# Patient Record
Sex: Female | Born: 1960 | Race: White | Hispanic: No | Marital: Married | Smoking: Current every day smoker
Health system: Southern US, Community
[De-identification: ages and names within clinical notes are randomized; demographics above are authoritative.]

## PROBLEM LIST (undated history)

## (undated) DIAGNOSIS — I10 Essential (primary) hypertension: Secondary | ICD-10-CM

## (undated) HISTORY — PX: CHOLECYSTECTOMY: SHX55

## (undated) HISTORY — DX: Essential (primary) hypertension: I10

## (undated) HISTORY — PX: ABDOMINAL HYSTERECTOMY: SHX81

---

## 2011-08-25 ENCOUNTER — Ambulatory Visit: Payer: Self-pay | Admitting: Family Medicine

## 2012-01-02 ENCOUNTER — Ambulatory Visit: Payer: Self-pay | Admitting: Gastroenterology

## 2012-01-03 ENCOUNTER — Ambulatory Visit: Payer: Self-pay | Admitting: Gastroenterology

## 2013-09-18 ENCOUNTER — Ambulatory Visit: Payer: Self-pay | Admitting: Family Medicine

## 2016-06-25 ENCOUNTER — Emergency Department: Payer: BLUE CROSS/BLUE SHIELD

## 2016-06-25 ENCOUNTER — Observation Stay
Admission: EM | Admit: 2016-06-25 | Discharge: 2016-06-26 | Disposition: A | Discharge status: Home / Self Care | Payer: BLUE CROSS/BLUE SHIELD | Attending: Internal Medicine | Admitting: Internal Medicine

## 2016-06-25 ENCOUNTER — Encounter: Payer: Self-pay | Admitting: Emergency Medicine

## 2016-06-25 DIAGNOSIS — H8110 Benign paroxysmal vertigo, unspecified ear: Secondary | ICD-10-CM | POA: Diagnosis not present

## 2016-06-25 DIAGNOSIS — Z79899 Other long term (current) drug therapy: Secondary | ICD-10-CM | POA: Diagnosis not present

## 2016-06-25 DIAGNOSIS — R0789 Other chest pain: Principal | ICD-10-CM | POA: Insufficient documentation

## 2016-06-25 DIAGNOSIS — R079 Chest pain, unspecified: Secondary | ICD-10-CM

## 2016-06-25 DIAGNOSIS — I10 Essential (primary) hypertension: Secondary | ICD-10-CM | POA: Diagnosis not present

## 2016-06-25 DIAGNOSIS — K219 Gastro-esophageal reflux disease without esophagitis: Secondary | ICD-10-CM | POA: Insufficient documentation

## 2016-06-25 DIAGNOSIS — Z8249 Family history of ischemic heart disease and other diseases of the circulatory system: Secondary | ICD-10-CM | POA: Insufficient documentation

## 2016-06-25 DIAGNOSIS — Z87891 Personal history of nicotine dependence: Secondary | ICD-10-CM | POA: Diagnosis not present

## 2016-06-25 DIAGNOSIS — E876 Hypokalemia: Secondary | ICD-10-CM | POA: Insufficient documentation

## 2016-06-25 DIAGNOSIS — Z23 Encounter for immunization: Secondary | ICD-10-CM | POA: Insufficient documentation

## 2016-06-25 LAB — FIBRIN DERIVATIVES D-DIMER (ARMC ONLY): Fibrin derivatives D-dimer (ARMC): 334.25 (ref 0.00–499.00)

## 2016-06-25 LAB — CBC
HCT: 39.3 % (ref 35.0–47.0)
Hemoglobin: 13.6 g/dL (ref 12.0–16.0)
MCH: 30.1 pg (ref 26.0–34.0)
MCHC: 34.6 g/dL (ref 32.0–36.0)
MCV: 86.9 fL (ref 80.0–100.0)
PLATELETS: 256 10*3/uL (ref 150–440)
RBC: 4.52 MIL/uL (ref 3.80–5.20)
RDW: 13 % (ref 11.5–14.5)
WBC: 7.3 10*3/uL (ref 3.6–11.0)

## 2016-06-25 LAB — COMPREHENSIVE METABOLIC PANEL
ALBUMIN: 3.8 g/dL (ref 3.5–5.0)
ALT: 14 U/L (ref 14–54)
ANION GAP: 6 (ref 5–15)
AST: 20 U/L (ref 15–41)
Alkaline Phosphatase: 81 U/L (ref 38–126)
BILIRUBIN TOTAL: 0.5 mg/dL (ref 0.3–1.2)
BUN: 14 mg/dL (ref 6–20)
CO2: 28 mmol/L (ref 22–32)
CREATININE: 0.77 mg/dL (ref 0.44–1.00)
Calcium: 8.7 mg/dL — ABNORMAL LOW (ref 8.9–10.3)
Chloride: 104 mmol/L (ref 101–111)
GFR calc Af Amer: >60 mL/min (ref >60)
GFR calc non Af Amer: >60 mL/min (ref >60)
GLUCOSE: 104 mg/dL — AB (ref 65–99)
Potassium: 3.3 mmol/L — ABNORMAL LOW (ref 3.5–5.1)
SODIUM: 138 mmol/L (ref 135–145)
Total Protein: 7 g/dL (ref 6.5–8.1)

## 2016-06-25 LAB — TROPONIN I

## 2016-06-25 MED ORDER — POTASSIUM CHLORIDE IN NACL 20-0.9 MEQ/L-% IV SOLN
INTRAVENOUS | Status: DC
  Administered 2016-06-25: 19:00:00 via INTRAVENOUS
  Filled 2016-06-25 (×2): qty 1000

## 2016-06-25 MED ORDER — ASPIRIN 325 MG PO TABS
325.0000 mg | ORAL_TABLET | Freq: Every day | ORAL | Status: DC
  Administered 2016-06-26: 325 mg via ORAL
  Filled 2016-06-25: qty 1

## 2016-06-25 MED ORDER — HYDROCHLOROTHIAZIDE 25 MG PO TABS
25.0000 mg | ORAL_TABLET | Freq: Every day | ORAL | Status: DC
  Administered 2016-06-26: 25 mg via ORAL
  Filled 2016-06-25: qty 1

## 2016-06-25 MED ORDER — NITROGLYCERIN 0.4 MG SL SUBL: 0.4000 mg | SUBLINGUAL_TABLET | SUBLINGUAL | Status: DC | PRN

## 2016-06-25 MED ORDER — MECLIZINE HCL 25 MG PO TABS
25.0000 mg | ORAL_TABLET | Freq: Three times a day (TID) | ORAL | Status: DC | PRN
  Administered 2016-06-25 – 2016-06-26 (×2): 25 mg via ORAL
  Filled 2016-06-25 (×2): qty 1

## 2016-06-25 MED ORDER — ENOXAPARIN SODIUM 40 MG/0.4ML ~~LOC~~ SOLN
40.0000 mg | SUBCUTANEOUS | Status: DC
  Administered 2016-06-25: 40 mg via SUBCUTANEOUS
  Filled 2016-06-25: qty 0.4

## 2016-06-25 MED ORDER — METOPROLOL TARTRATE 5 MG/5ML IV SOLN: 5.0000 mg | INTRAVENOUS | Status: DC | PRN

## 2016-06-25 MED ORDER — MECLIZINE HCL 25 MG PO TABS
25.0000 mg | ORAL_TABLET | Freq: Once | ORAL | Status: AC
  Administered 2016-06-25: 25 mg via ORAL

## 2016-06-25 MED ORDER — PNEUMOCOCCAL VAC POLYVALENT 25 MCG/0.5ML IJ INJ
0.5000 mL | INJECTION | INTRAMUSCULAR | Status: AC
  Administered 2016-06-26: 0.5 mL via INTRAMUSCULAR
  Filled 2016-06-25: qty 0.5

## 2016-06-25 MED ORDER — ACETAMINOPHEN 325 MG PO TABS
650.0000 mg | ORAL_TABLET | Freq: Four times a day (QID) | ORAL | Status: DC | PRN
  Administered 2016-06-25 – 2016-06-26 (×2): 650 mg via ORAL
  Filled 2016-06-25 (×2): qty 2

## 2016-06-25 MED ORDER — PANTOPRAZOLE SODIUM 40 MG PO TBEC
40.0000 mg | DELAYED_RELEASE_TABLET | Freq: Every day | ORAL | Status: DC
  Administered 2016-06-26: 40 mg via ORAL
  Filled 2016-06-25: qty 1

## 2016-06-25 MED ORDER — NICOTINE 14 MG/24HR TD PT24: 14.0000 mg | MEDICATED_PATCH | Freq: Every day | TRANSDERMAL | Status: DC

## 2016-06-25 MED ORDER — GI COCKTAIL ~~LOC~~
30.0000 mL | Freq: Once | ORAL | Status: AC
  Administered 2016-06-25: 30 mL via ORAL
  Filled 2016-06-25: qty 30

## 2016-06-25 MED ORDER — ACETAMINOPHEN 650 MG RE SUPP: 650.0000 mg | Freq: Four times a day (QID) | RECTAL | Status: DC | PRN

## 2016-06-25 MED ORDER — ASPIRIN 81 MG PO CHEW
324.0000 mg | CHEWABLE_TABLET | Freq: Once | ORAL | Status: AC
  Administered 2016-06-25: 324 mg via ORAL
  Filled 2016-06-25: qty 4

## 2016-06-25 MED ORDER — SODIUM CHLORIDE 0.9% FLUSH
3.0000 mL | Freq: Two times a day (BID) | INTRAVENOUS | Status: DC
  Administered 2016-06-25 – 2016-06-26 (×2): 3 mL via INTRAVENOUS

## 2016-06-25 NOTE — ED Triage Notes (Signed)
Intermittent chest pains that are accompanied by dizziness x 2 days. States [place]. Denies leg edema or pain.

## 2016-06-25 NOTE — ED Notes (Signed)
Admitting Provider at bedside. 

## 2016-06-25 NOTE — ED Provider Notes (Signed)
[facility] Emergency Department Provider Note   ____________________________________________   First MD Initiated Contact with Patient 06/25/16 1341     (approximate)  I have reviewed the triage vital signs and the nursing notes.   HISTORY  Chief Complaint Chest Pain   HPI Loretta Tyler is a 56 y.o. female here for evaluation of chest pain  Patient was in [place], she was hiking through [place] when she began experiencing a pain across the center of her chest is hard to describe then felt very hot, sweaty, lightheaded. This lasted for a little while, somewhat hard for her to recall how long. Over the last couple of days she's had 2 more episodes and she is concerned she could be having a "heart attack".  At the present time her symptoms have improved. Last episode of symptoms described this morning.   Was associated with some nausea but no vomiting. No leg swelling or thigh pain. She did travel to [place] by car symptoms started shortly thereafter   History reviewed. No pertinent past medical history.  Patient Active Problem List   Diagnosis Date Noted  . HTN (hypertension) 06/25/2016  . Chest pain 06/25/2016    Past Surgical History:  Procedure Laterality Date  . ABDOMINAL HYSTERECTOMY    . CHOLECYSTECTOMY      Prior to Admission medications   Medication Sig Start Date End Date Taking? Authorizing Provider  hydrochlorothiazide (HYDRODIURIL) 25 MG tablet Take 25 mg by mouth daily.   Yes Historical Provider, MD  ranitidine (ZANTAC) 150 MG tablet Take 150 mg by mouth daily as needed for heartburn.   Yes Historical Provider, MD    Allergies Lodine [etodolac] and Morphine and related  No family history on file.  Social History Social History  Substance Use Topics  . Smoking status: Former Games developer  . Smokeless tobacco: Not on file  . Alcohol use Not on file    Review of Systems Constitutional: No fever/chills Eyes: No visual  changes. ENT: No sore throat. Cardiovascular: See history of present illness Respiratory: See history of present illness. Denies feeling short of breath now. Sometimes in the pain and she feels that her breath away Gastrointestinal: No abdominal pain.  No nausea, no vomiting.  No diarrhea.  No constipation. Genitourinary: Negative for dysuria. Musculoskeletal: Negative for back pain. Skin: Negative for rash. Neurological: Negative for headaches, focal weakness or numbness.  10-point ROS otherwise negative.  ____________________________________________   PHYSICAL EXAM:  VITAL SIGNS: ED Triage Vitals [06/25/16 1312]  Enc Vitals Group     BP (!) 147/89     Pulse Rate 72     Resp 18     Temp 97.6 F (36.4 C)     Temp Source Oral     SpO2 98 %     Weight 140 lb (63.5 kg)     Height  (1.6 m)     Head Circumference      Peak Flow      Pain Score 1     Pain Loc      Pain Edu?      Excl. in GC?     Constitutional: Alert and oriented. Well appearing and in no acute distress. Eyes: Conjunctivae are normal. PERRL. EOMI. Head: Atraumatic. Nose: No congestion/rhinnorhea. Mouth/Throat: Mucous membranes are moist.  Oropharynx non-erythematous. Neck: No stridor.   Cardiovascular: Normal rate, regular rhythm. Grossly normal heart sounds.  Good peripheral circulation. Respiratory: Normal respiratory effort.  No retractions. Lungs CTAB. Gastrointestinal: Soft and  nontender. No distention.  Musculoskeletal: No lower extremity tenderness nor edema.  No thigh swelling. Equal calf circumference bilateral Neurologic:  Normal speech and language. No gross focal neurologic deficits are appreciated.  Skin:  Skin is warm, dry and intact. No rash noted. Psychiatric: Mood and affect are normal. Speech and behavior are normal.  ____________________________________________   LABS (all labs ordered are listed, but only abnormal results are displayed)  Labs Reviewed  COMPREHENSIVE  METABOLIC PANEL - Abnormal; Notable for the following:       Result Value   Potassium 3.3 (*)    Glucose, Bld 104 (*)    Calcium 8.7 (*)    All other components within normal limits  CBC  TROPONIN I  FIBRIN DERIVATIVES D-DIMER ([facility] ONLY)   ____________________________________________  EKG  ED ECG REPORT I, Rushawn Capshaw, the attending physician, personally viewed and interpreted this ECG.  Date: 06/25/2016 EKG Time: 1315 Rhythm: normal sinus rhythm QRS Axis: normal Intervals: normal ST/T Wave abnormalities: normal Conduction Disturbances: none Narrative Interpretation: unremarkable  ____________________________________________  RADIOLOGY  Dg Chest 2 View  Result Date: 06/25/2016 CLINICAL DATA:  Chest pain for 2 days. EXAM: CHEST  2 VIEW COMPARISON:  None. FINDINGS: No pneumothorax. Elevation of the left hemidiaphragm with adjacent atelectasis identified. The heart, hila, and mediastinum are normal. No pulmonary nodules or masses. No suspicious infiltrates. IMPRESSION: No active cardiopulmonary disease. Electronically Signed   By: Gerome Sam III M.D   On: 06/25/2016 14:51    ____________________________________________   PROCEDURES  Procedure(s) performed: None  Procedures  Critical Care performed: No  ____________________________________________   INITIAL IMPRESSION / ASSESSMENT AND PLAN / ED COURSE  Pertinent labs & imaging results that were available during my care of the patient were reviewed by me and considered in my medical decision making (see chart for details).  Temperature hours of chest pain. Some concerning symptoms and that she has chest pain followed by feeling of dizziness, slight breathlessness, got sweaty and occurred first while exerting herself walking. No obvious evidence of pulmonary wasn't or DVT, we'll screen via d-dimer given the patient's recent travel history. At the present time she is asymptomatic. She does have risk factors for  coronary disease including age, family history, smoking history and her symptoms are moderately concerning raising her wrist to moderate by heart score.  Do ripping, tear, or moving pain. No symptoms of dissection.    D-dimer < 500. Low risk WELLS for PE.     ____________________________________________   FINAL CLINICAL IMPRESSION(S) / ED DIAGNOSES  Final diagnoses:  Chest pain, moderate coronary artery risk      NEW MEDICATIONS STARTED DURING THIS VISIT:  New Prescriptions   No medications on file     Note:  This document was prepared using Dragon voice recognition software and may include unintentional dictation errors.     Sharyn Creamer, MD 06/25/16 [phone]

## 2016-06-25 NOTE — H&P (Signed)
PCP:   Marisue Ivan, MD   Chief Complaint:  Chest pain , dizziness  HPI: This is a 56 year old female who states that she developed centrally located chest pains on Thursday. Denies any radiation of the pain. Chest pain is associated with dizziness each time. Her chest pain is intermittent and has been coming and going since then. At this worse it is 7/10, currently 0/10. She does complain of dizziness currently. She denies any palpitation, shortness of breath. She states that she does feel presyncopal with her dizziness. She describes the pain as dull, she doesn't usually have chest discomfort. She denies any lower extremity edema. She states the pain is not associated with movement and it has occurred at rest as well as with activity. She does report severe GERD but states this comes and controlled with Zantac which she has been taking. She states that her chest discomfort currently does not feel like her heartburn. She's never had stress tests. She states the discomfort initially occurred while she is awaiting in [place] at the time she was horseback riding and mostly walking. She reports a cough but this is a chronic smoker's cough. She denies any fever, chills, nausea, vomiting or diarrhea. She denies any lower extremity edema. She finally presented to the ER. She does report to tinnitus which is chronic but denies any hearing loss. History provided the patient as well as her husband who is present at bedside.  Review of Systems:  The patient denies anorexia, fever, weight loss,, vision loss, decreased hearing, tinnitus, hoarseness, chest pain, syncope, dyspnea on exertion, peripheral edema, balance deficits, dizziness, hemoptysis, abdominal pain, melena, hematochezia, severe indigestion/heartburn, hematuria, incontinence, genital sores, muscle weakness, suspicious skin lesions, transient blindness, difficulty walking, depression, unusual weight change, abnormal bleeding, enlarged lymph nodes,  angioedema, and breast masses.  Past Medical History: History reviewed. No pertinent past medical history. Past Surgical History:  Procedure Laterality Date  . ABDOMINAL HYSTERECTOMY    . CHOLECYSTECTOMY      Medications: Prior to Admission medications   Medication Sig Start Date End Date Taking? Authorizing Provider  hydrochlorothiazide (HYDRODIURIL) 25 MG tablet Take 25 mg by mouth daily.   Yes Historical Provider, MD  ranitidine (ZANTAC) 150 MG tablet Take 150 mg by mouth daily as needed for heartburn.   Yes Historical Provider, MD    Allergies:   Allergies  Allergen Reactions  . Lodine [Etodolac] Hives  . Morphine And Related Nausea And Vomiting    Social History: Positive for tobacco. Negative for alcohol, illicit drugs  Family History: CAD, father age 42 Breast cancer, mother  Physical Exam: Vitals:   06/25/16 1312 06/25/16 1537  BP: (!) 147/89 (!) 148/84  Pulse: 72 63  Resp: 18   Temp: 97.6 F (36.4 C)   TempSrc: Oral   SpO2: 98% 97%  Weight: 63.5 kg (140 lb)   Height:  (1.6 m)     General:  Alert and oriented times three, well developed and nourished, no acute distress Eyes: PERRLA, pink conjunctiva, no scleral icterus ENT: Moist oral mucosa, neck supple, no thyromegaly Lungs: clear to ascultation, no wheeze, no crackles, no use of accessory muscles Cardiovascular: regular rate and rhythm, no regurgitation, no gallops, no murmurs. No carotid bruits, no JVD, no reproducible chest wall pain Abdomen: soft, positive BS, non-tender, non-distended, no organomegaly, not an acute abdomen GU: not examined Neuro: CN II - XII grossly intact, sensation intact Musculoskeletal: strength 5/5 all extremities, no clubbing, cyanosis or edema Skin: no rash, no  subcutaneous crepitation, no decubitus Psych: appropriate patient   Labs on Admission:   Recent Labs  06/25/16 1313  NA 138  K 3.3*  CL 104  CO2 28  GLUCOSE 104*  BUN 14  CREATININE 0.77  CALCIUM  8.7*    Recent Labs  06/25/16 1313  AST 20  ALT 14  ALKPHOS 81  BILITOT 0.5  PROT 7.0  ALBUMIN 3.8   No results for input(s): LIPASE, AMYLASE in the last 72 hours.  Recent Labs  06/25/16 1313  WBC 7.3  HGB 13.6  HCT 39.3  MCV 86.9  PLT 256    Recent Labs  06/25/16 1313  TROPONINI <0.03   Invalid input(s): POCBNP No results for input(s): DDIMER in the last 72 hours. No results for input(s): HGBA1C in the last 72 hours. No results for input(s): CHOL, HDL, LDLCALC, TRIG, CHOLHDL, LDLDIRECT in the last 72 hours. No results for input(s): TSH, T4TOTAL, T3FREE, THYROIDAB in the last 72 hours.  Invalid input(s): FREET3 No results for input(s): VITAMINB12, FOLATE, FERRITIN, TIBC, IRON, RETICCTPCT in the last 72 hours.  Micro Results: No results found for this or any previous visit (from the past 240 hour(s)).   Radiological Exams on Admission: Dg Chest 2 View  Result Date: 06/25/2016 CLINICAL DATA:  Chest pain for 2 days. EXAM: CHEST  2 VIEW COMPARISON:  None. FINDINGS: No pneumothorax. Elevation of the left hemidiaphragm with adjacent atelectasis identified. The heart, hila, and mediastinum are normal. No pulmonary nodules or masses. No suspicious infiltrates. IMPRESSION: No active cardiopulmonary disease. Electronically Signed   By: Gerome Sam III M.D   On: 06/25/2016 14:51    Assessment/Plan Present on Admission: . Chest pain -Admit to med telemetry -Cycle cardiac enzymes, lipid panel in a.m. -Lopressor when necessary -Orthostatic vitals ordered, repeat d-dimer in a.m. -IV fluid hydration with potassium -Single dose of GI cocktail ordered. Protonix by mouth daily -Stress test per primary team, inpatient versus outpatient -Resume home medications  Mild hypokalemia -Normal saline with 20 mEq potassium IV -Magnesium level  Hypertension -Stable, home medications resumed. When necessary Lopressor ordered  Tobacco abuse -Nicotine patch daily, duonebs  PRN Zali Kamaka 06/25/2016, 3:55 PM

## 2016-06-26 LAB — CBC
HEMATOCRIT: 38.1 % (ref 35.0–47.0)
HEMOGLOBIN: 12.8 g/dL (ref 12.0–16.0)
MCH: 29.9 pg (ref 26.0–34.0)
MCHC: 33.7 g/dL (ref 32.0–36.0)
MCV: 88.9 fL (ref 80.0–100.0)
Platelets: 218 10*3/uL (ref 150–440)
RBC: 4.29 MIL/uL (ref 3.80–5.20)
RDW: 12.8 % (ref 11.5–14.5)
WBC: 7.1 10*3/uL (ref 3.6–11.0)

## 2016-06-26 LAB — BASIC METABOLIC PANEL
ANION GAP: 6 (ref 5–15)
BUN: 17 mg/dL (ref 6–20)
CALCIUM: 8.4 mg/dL — AB (ref 8.9–10.3)
CO2: 28 mmol/L (ref 22–32)
CREATININE: 0.87 mg/dL (ref 0.44–1.00)
Chloride: 105 mmol/L (ref 101–111)
GFR calc non Af Amer: >60 mL/min (ref >60)
Glucose, Bld: 108 mg/dL — ABNORMAL HIGH (ref 65–99)
Potassium: 4 mmol/L (ref 3.5–5.1)
SODIUM: 139 mmol/L (ref 135–145)

## 2016-06-26 LAB — LIPID PANEL
Cholesterol: 153 mg/dL (ref 0–200)
HDL: 47 mg/dL (ref >40)
LDL CALC: 92 mg/dL (ref 0–99)
TRIGLYCERIDES: 68 mg/dL (ref <150)
Total CHOL/HDL Ratio: 3.3 RATIO
VLDL: 14 mg/dL (ref 0–40)

## 2016-06-26 LAB — FIBRIN DERIVATIVES D-DIMER (ARMC ONLY): Fibrin derivatives D-dimer (ARMC): 245.54 (ref 0.00–499.00)

## 2016-06-26 MED ORDER — NICOTINE 14 MG/24HR TD PT24: 14.0000 mg | MEDICATED_PATCH | Freq: Every day | TRANSDERMAL | 0 refills | Status: DC

## 2016-06-26 MED ORDER — MECLIZINE HCL 12.5 MG PO TABS: 12.5000 mg | ORAL_TABLET | Freq: Three times a day (TID) | ORAL | 0 refills | Status: AC | PRN

## 2016-06-26 NOTE — Discharge Instructions (Signed)

## 2016-06-26 NOTE — Discharge Summary (Signed)
[facility] - [facility] ([place])   PATIENT NAME: Loretta Tyler    MR#:  [id number]  DATE OF BIRTH:  1960-05-06  DATE OF ADMISSION:  06/25/2016 ADMITTING PHYSICIAN: Gery Pray, MD  DATE OF DISCHARGE: 06/26/2016  9:44 AM  PRIMARY CARE PHYSICIAN: Marisue Ivan, MD    ADMISSION DIAGNOSIS:  Chest pain, moderate coronary artery risk [R07.9]  DISCHARGE DIAGNOSIS:  Active Problems:   HTN (hypertension)   Chest pain   SECONDARY DIAGNOSIS:  History reviewed. No pertinent past medical history.  HOSPITAL COURSE:   1. Chest pain observation. The patient's cardiac enzymes were negative 3. The patient describes her chest pain as a half a second at a time starting last Thursday. Since the patient was admitted on Saturday evening no testing available on Sunday. Can follow-up as outpatient. Chest pain had resolved. Can consider outpatient stress test. 2. Essential hypertension on hydrochlorothiazide 3. Benign positional vertigo. I did put the patient through an Epley maneuver and brought on her dizziness and then relieved her dizziness. When necessary meclizine. Tympanic membranes bilaterally showed no erythema. 4. Hypokalemia replaced during the hospital course. Follow-up as outpatient and determine on whether hydrochlorothiazide is a good medication for this patient and not depending on her next potassium level.  DISCHARGE CONDITIONS:   Satisfactory  CONSULTS OBTAINED:   none  DRUG ALLERGIES:   Allergies  Allergen Reactions  . Lodine [Etodolac] Hives  . Morphine And Related Nausea And Vomiting    DISCHARGE MEDICATIONS:   Discharge Medication List as of 06/26/2016  9:06 AM    START taking these medications   Details  meclizine (ANTIVERT) 12.5 MG tablet Take 1 tablet (12.5 mg total) by mouth 3 (three) times daily as needed for dizziness., Starting Sun 06/26/2016, Print    nicotine (NICODERM CQ - DOSED IN MG/24 HOURS) 14 mg/24hr patch Place 1 patch (14 mg  total) onto the skin daily., Starting Sun 06/26/2016, Print      CONTINUE these medications which have NOT CHANGED   Details  hydrochlorothiazide (HYDRODIURIL) 25 MG tablet Take 25 mg by mouth daily., Historical Med    ranitidine (ZANTAC) 150 MG tablet Take 150 mg by mouth daily as needed for heartburn., Historical Med         DISCHARGE INSTRUCTIONS:   Follow-up PMD one week  If you experience worsening of your admission symptoms, develop shortness of breath, life threatening emergency, suicidal or homicidal thoughts you must seek medical attention immediately by calling 911 or calling your MD immediately  if symptoms less severe.  You Must read complete instructions/literature along with all the possible adverse reactions/side effects for all the Medicines you take and that have been prescribed to you. Take any new Medicines after you have completely understood and accept all the possible adverse reactions/side effects.   Please note  You were cared for by a hospitalist during your hospital stay. If you have any questions about your discharge medications or the care you received while you were in the hospital after you are discharged, you can call the unit and asked to speak with the hospitalist on call if the hospitalist that took care of you is not available. Once you are discharged, your primary care physician will handle any further medical issues. Please note that NO REFILLS for any discharge medications will be authorized once you are discharged, as it is imperative that you return to your primary care physician (or establish a relationship with a primary care physician if you do not  have one) for your aftercare needs so that they can reassess your need for medications and monitor your lab values.    Today   CHIEF COMPLAINT:   Chief Complaint  Patient presents with  . Chest Pain    HISTORY OF PRESENT ILLNESS:  Loretta Tyler  is a 56 y.o. female with a known history of  Hypertension presented with atypical chest pain and vertigo   VITAL SIGNS:  Blood pressure 137/66, pulse 70, temperature 98 F (36.7 C), temperature source Oral, resp. rate 18, height  (1.6 m), weight 63.5 kg (140 lb), SpO2 100 %.  I/O:   Intake/Output Summary (Last 24 hours) at 06/26/16 1545 Last data filed at 06/26/16 0600  Gross per 24 hour  Intake              855 ml  Output                0 ml  Net              855 ml    PHYSICAL EXAMINATION:  GENERAL:  56 y.o.-year-old patient lying in the bed with no acute distress.  EYES: Pupils equal, round, reactive to light and accommodation. No scleral icterus. Extraocular muscles intact.  HEENT: Head atraumatic, normocephalic. Oropharynx and nasopharynx clear.  NECK:  Supple, no jugular venous distention. No thyroid enlargement, no tenderness.  LUNGS: Normal breath sounds bilaterally, no wheezing, rales,rhonchi or crepitation. No use of accessory muscles of respiration.  CARDIOVASCULAR: S1, S2 normal. No murmurs, rubs, or gallops.  ABDOMEN: Soft, non-tender, non-distended. Bowel sounds present. No organomegaly or mass.  EXTREMITIES: No pedal edema, cyanosis, or clubbing.  NEUROLOGIC: Cranial nerves II through XII are intact. Muscle strength 5/5 in all extremities. Sensation intact. Gait not checked. Romberg negative.  finger-nose intact bilaterally. Epley maneuver brought on dizziness and then resolved her dizziness PSYCHIATRIC: The patient is alert and oriented x 3.  SKIN: No obvious rash, lesion, or ulcer.   DATA REVIEW:   CBC  Recent Labs Lab 06/26/16 0519  WBC 7.1  HGB 12.8  HCT 38.1  PLT 218    Chemistries   Recent Labs Lab 06/25/16 1313 06/26/16 0519  NA 138 139  K 3.3* 4.0  CL 104 105  CO2 28 28  GLUCOSE 104* 108*  BUN 14 17  CREATININE 0.77 0.87  CALCIUM 8.7* 8.4*  AST 20  --   ALT 14  --   ALKPHOS 81  --   BILITOT 0.5  --     Cardiac Enzymes  Recent Labs Lab 06/25/16 2047  TROPONINI <0.03      RADIOLOGY:  Dg Chest 2 View  Result Date: 06/25/2016 CLINICAL DATA:  Chest pain for 2 days. EXAM: CHEST  2 VIEW COMPARISON:  None. FINDINGS: No pneumothorax. Elevation of the left hemidiaphragm with adjacent atelectasis identified. The heart, hila, and mediastinum are normal. No pulmonary nodules or masses. No suspicious infiltrates. IMPRESSION: No active cardiopulmonary disease. Electronically Signed   By: Gerome Sam III M.D   On: 06/25/2016 14:51    Management plans discussed with the patient, And she is in agreement.  CODE STATUS:  Code Status History    Date Active Date Inactive Code Status Order ID Comments User Context   06/25/2016  6:01 PM 06/26/2016 12:49 PM Full Code [id number]  Gery Pray, MD Inpatient      TOTAL TIME TAKING CARE OF THIS PATIENT: 38 minutes.    Alford Highland M.D on 06/26/2016 at 3:45  PM  Between 7am to 6pm - Pager - [phone]  After 6pm go to [email or web address] - password [email or web address]  [facility] Office  [phone]  CC: Primary care physician; Marisue Ivan, MD

## 2016-06-26 NOTE — Progress Notes (Signed)
,  Patient given discharge teaching and paperwork regarding medications, diet, follow-up appointments and activity. Patient understanding verbalized. No complaints at this time. IV and telemetry discontinued prior to leaving. Skin assessment as previously charted and vitals are stable; on room air. Patient being discharged to home. Caregiver/family present during discharge teaching. Prescriptions handed to patient.

## 2016-06-27 LAB — HIV ANTIBODY (ROUTINE TESTING W REFLEX): HIV Screen 4th Generation wRfx: NONREACTIVE

## 2018-12-01 ENCOUNTER — Emergency Department: Payer: BC Managed Care – PPO

## 2018-12-01 ENCOUNTER — Other Ambulatory Visit: Payer: Self-pay

## 2018-12-01 DIAGNOSIS — R0602 Shortness of breath: Secondary | ICD-10-CM | POA: Diagnosis not present

## 2018-12-01 DIAGNOSIS — R200 Anesthesia of skin: Secondary | ICD-10-CM | POA: Insufficient documentation

## 2018-12-01 DIAGNOSIS — Z87891 Personal history of nicotine dependence: Secondary | ICD-10-CM | POA: Diagnosis not present

## 2018-12-01 DIAGNOSIS — R079 Chest pain, unspecified: Secondary | ICD-10-CM | POA: Insufficient documentation

## 2018-12-01 DIAGNOSIS — I10 Essential (primary) hypertension: Secondary | ICD-10-CM | POA: Insufficient documentation

## 2018-12-01 DIAGNOSIS — Z79899 Other long term (current) drug therapy: Secondary | ICD-10-CM | POA: Insufficient documentation

## 2018-12-01 LAB — COMPREHENSIVE METABOLIC PANEL
ALT: 11 U/L (ref 0–44)
AST: 14 U/L — ABNORMAL LOW (ref 15–41)
Albumin: 4.1 g/dL (ref 3.5–5.0)
Alkaline Phosphatase: 87 U/L (ref 38–126)
Anion gap: 9 (ref 5–15)
BUN: 13 mg/dL (ref 6–20)
CO2: 25 mmol/L (ref 22–32)
Calcium: 9.1 mg/dL (ref 8.9–10.3)
Chloride: 103 mmol/L (ref 98–111)
Creatinine, Ser: 0.75 mg/dL (ref 0.44–1.00)
GFR calc Af Amer: >60 mL/min (ref >60)
GFR calc non Af Amer: >60 mL/min (ref >60)
Glucose, Bld: 105 mg/dL — ABNORMAL HIGH (ref 70–99)
Potassium: 3.6 mmol/L (ref 3.5–5.1)
Sodium: 137 mmol/L (ref 135–145)
Total Bilirubin: 0.7 mg/dL (ref 0.3–1.2)
Total Protein: 7.2 g/dL (ref 6.5–8.1)

## 2018-12-01 LAB — CBC
HCT: 43 % (ref 36.0–46.0)
Hemoglobin: 14.2 g/dL (ref 12.0–15.0)
MCH: 28.7 pg (ref 26.0–34.0)
MCHC: 33 g/dL (ref 30.0–36.0)
MCV: 86.9 fL (ref 80.0–100.0)
Platelets: 288 10*3/uL (ref 150–400)
RBC: 4.95 MIL/uL (ref 3.87–5.11)
RDW: 12.4 % (ref 11.5–15.5)
WBC: 8.1 10*3/uL (ref 4.0–10.5)
nRBC: 0 % (ref 0.0–0.2)

## 2018-12-01 LAB — TROPONIN I (HIGH SENSITIVITY): Troponin I (High Sensitivity): 3 ng/L (ref <18)

## 2018-12-01 NOTE — ED Triage Notes (Addendum)
Pt states she has had intermittent left and right sided chest pain with feeling like she is going to faint when standing, shortness of breath, left sided arm numbness, jaw pain and throat pain. Pt states she has had symptoms for three days, pt denies known fever.

## 2018-12-02 ENCOUNTER — Other Ambulatory Visit: Payer: Self-pay

## 2018-12-02 ENCOUNTER — Emergency Department
Admission: EM | Admit: 2018-12-02 | Discharge: 2018-12-02 | Disposition: A | Discharge status: Home / Self Care | Payer: BC Managed Care – PPO | Attending: Emergency Medicine | Admitting: Emergency Medicine

## 2018-12-02 ENCOUNTER — Encounter: Payer: Self-pay | Admitting: Radiology

## 2018-12-02 ENCOUNTER — Emergency Department: Payer: BC Managed Care – PPO

## 2018-12-02 DIAGNOSIS — R079 Chest pain, unspecified: Secondary | ICD-10-CM

## 2018-12-02 LAB — TROPONIN I (HIGH SENSITIVITY): Troponin I (High Sensitivity): 3 ng/L (ref <18)

## 2018-12-02 MED ORDER — IOHEXOL 350 MG/ML SOLN
100.0000 mL | Freq: Once | INTRAVENOUS | Status: AC | PRN
  Administered 2018-12-02: 100 mL via INTRAVENOUS

## 2018-12-02 NOTE — ED Provider Notes (Signed)
[facility] Emergency Department Provider Note  ____________________________________________   First MD Initiated Contact with Patient 12/02/18 [id number]     (approximate)  I have reviewed the triage vital signs and the nursing notes.   HISTORY  Chief Complaint Chest Pain    HPI Loretta Tyler is a 58 y.o. female with history of hypertension who presents for evaluation of about 2 days of intermittent chest pain.  She says that the pain is anywhere from mild to severe and usually lasts for a few minutes.  Nothing particular makes it better or worse and it does not matter if she is exerting herself or not, it can happen while she is sitting and reading a book.  It is on both sides of her chest as well as in the middle and feels sharp.  She occasionally feels short of breath with the pain and each time that she has the pain in her chest she says that her right leg goes numb like it has fallen asleep.  This is consistent and she says it happens every time.  Occasionally she has had some intermittent numbness radiating down her left arm as well.  To me she denied having any jaw pain or throat pain.  She has not had any contact with known COVID-19 patients.  She denies sore throat, loss of smell or taste, cough, shortness of breath except associated with the chest pain, nausea, vomiting, and abdominal pain.  No weakness in her arms or her legs.  No trauma recently.  No history of blood clots in her legs or her lungs.  No unilateral leg pain or swelling.  No recent surgeries or immobilizations.   She occasionally feels like she is going to pass out when she has the symptoms.        History reviewed. No pertinent past medical history.  Patient Active Problem List   Diagnosis Date Noted   HTN (hypertension) 06/25/2016   Chest pain 06/25/2016    Past Surgical History:  Procedure Laterality Date   ABDOMINAL HYSTERECTOMY     CHOLECYSTECTOMY      Prior to Admission  medications   Medication Sig Start Date End Date Taking? Authorizing Provider  hydrochlorothiazide (HYDRODIURIL) 25 MG tablet Take 25 mg by mouth daily.    [provider]  meclizine (ANTIVERT) 12.5 MG tablet Take 1 tablet (12.5 mg total) by mouth 3 (three) times daily as needed for dizziness. 06/26/16   Alford Highland, MD  nicotine (NICODERM CQ - DOSED IN MG/24 HOURS) 14 mg/24hr patch Place 1 patch (14 mg total) onto the skin daily. 06/26/16   Alford Highland, MD  ranitidine (ZANTAC) 150 MG tablet Take 150 mg by mouth daily as needed for heartburn.    [provider]    Allergies Lodine [etodolac] and Morphine and related  No family history on file.  Social History Social History   Tobacco Use   Smoking status: Former Smoker   Smokeless tobacco: Never Used  Substance Use Topics   Alcohol use: Not on file   Drug use: Not on file    Review of Systems Constitutional: No fever/chills Eyes: No visual changes. ENT: No sore throat. Cardiovascular: +chest pain. Respiratory: Shortness of breath associated with the chest pain. Gastrointestinal: No abdominal pain.  No nausea, no vomiting.  No diarrhea.  No constipation. Genitourinary: Negative for dysuria. Musculoskeletal: Negative for neck pain.  Negative for back pain. Integumentary: Negative for rash. Neurological: Numbness of the right leg associated with the  chest pain.  Occasional numbness in the left arm also associated with the chest pain.   ____________________________________________   PHYSICAL EXAM:  VITAL SIGNS: ED Triage Vitals  Enc Vitals Group     BP 12/01/18 1912 (!) 159/93     Pulse Rate 12/01/18 1912 (!) 56     Resp 12/01/18 1912 16     Temp 12/01/18 1912 98 F (36.7 C)     Temp Source 12/01/18 1912 Oral     SpO2 12/01/18 1912 98 %     Weight 12/01/18 1913 67.1 kg (148 lb)     Height 12/01/18 1913 1.6 m (5\' 3" )     Head Circumference --      Peak Flow --      Pain Score 12/01/18  1912 0     Pain Loc --      Pain Edu? --      Excl. in [place]? --     Constitutional: Alert and oriented.  Anxious but not in distress and experiencing no pain at this time. Eyes: Conjunctivae are normal.  Head: Atraumatic. Nose: No congestion/rhinnorhea. Mouth/Throat: Mucous membranes are moist. Neck: No stridor.  No meningeal signs.   Cardiovascular: Normal rate, regular rhythm. Good peripheral circulation. Grossly normal heart sounds. Respiratory: Normal respiratory effort.  No retractions. Gastrointestinal: Soft and nontender. No distention.  No abdominal bruits or pulsatile masses. Musculoskeletal: No lower extremity tenderness nor edema. No gross deformities of extremities. Neurologic:  Normal speech and language. No gross focal neurologic deficits are appreciated.  Skin:  Skin is warm, dry and intact. Psychiatric: Mood and affect are anxious but generally appropriate under the circumstances.  ____________________________________________   LABS (all labs ordered are listed, but only abnormal results are displayed)  Labs Reviewed  COMPREHENSIVE METABOLIC PANEL - Abnormal; Notable for the following components:      Result Value   Glucose, Bld 105 (*)    AST 14 (*)    All other components within normal limits  CBC  TROPONIN I (HIGH SENSITIVITY)  TROPONIN I (HIGH SENSITIVITY)   ____________________________________________  EKG  ED ECG REPORT I, Hinda Kehr, the attending physician, personally viewed and interpreted this ECG.  Date: 12/01/2018 EKG Time: 19: 09 Rate: 53 Rhythm: Sinus bradycardia QRS Axis: normal Intervals: normal ST/T Wave abnormalities: normal Narrative Interpretation: no evidence of acute ischemia.  There is some artifact present but generally the EKG is reassuring.  ____________________________________________  RADIOLOGY I, Hinda Kehr, personally viewed and evaluated these images (plain radiographs) as part of my medical decision making, as  well as reviewing the written report by the radiologist.  ED MD interpretation: Chest x-ray does not demonstrate any emergent findings including no widened mediastinum.   No acute/emergent abnormalities identified on CTA chest/abd/pelvis.  Official radiology report(s): Dg Chest 2 View  Result Date: 12/01/2018 CLINICAL DATA:  Chest pain EXAM: CHEST - 2 VIEW COMPARISON:  06/25/2016 FINDINGS: Elevated left diaphragm. Subsegmental atelectasis or scar at the left base. Right lung is clear. Heart size is normal. No pneumothorax. IMPRESSION: No active cardiopulmonary disease. Subsegmental atelectasis or scar at the left base. Electronically Signed   By: Donavan Foil M.D.   On: 12/01/2018 19:40   Ct Angio Chest/abd/pel For Dissection W And/or W/wo  Result Date: 12/02/2018 CLINICAL DATA:  Chest pain, intermittent left arm numbness, jaw and throat pain EXAM: CT ANGIOGRAPHY CHEST, ABDOMEN AND PELVIS TECHNIQUE: Multidetector CT imaging through the chest, abdomen and pelvis was performed using the standard protocol during bolus administration of  intravenous contrast. Multiplanar reconstructed images and MIPs were obtained and reviewed to evaluate the vascular anatomy. CONTRAST:  OMNIPAQUE IOHEXOL 350 MG/ML SOLN COMPARISON:  Chest radiograph 12/01/2018 FINDINGS: CTA CHEST FINDINGS Cardiovascular: Noncontrast CT images of the chest reveal no abnormal hyperdense mural thickening or plaque displacement to suggest intramural hematoma of the thoracic aorta. Postcontrast imaging demonstrates preferential opacification of the aorta. The aortic root is suboptimally assessed given cardiac pulsation artifact. Atherosclerotic plaque within the normal caliber aorta. No intramural hematoma, dissection flap or other acute luminal abnormality of the aorta is seen. No periaortic stranding or hemorrhage. Central pulmonary arteries are normal caliber. No central or lobar filling defects are identified. Normal heart size. No  pericardial effusion. Mediastinum/Nodes: No enlarged mediastinal or axillary lymph nodes. Thyroid gland, trachea, and esophagus demonstrate no significant findings. Lungs/Pleura: Bandlike area of opacity in the lingula, likely atelectasis and/or scarring. Mild dependent atelectasis is present posteriorly. No consolidation, features of edema, pneumothorax, or effusion. No suspicious pulmonary nodules or masses. Musculoskeletal: No acute osseous abnormality or suspicious osseous lesion. No suspicious chest wall abnormalities. Review of the MIP images confirms the above findings. CTA ABDOMEN AND PELVIS FINDINGS VASCULAR Aorta: Atherosclerotic plaque within the normal caliber aorta. No evidence of aneurysm, dissection or vasculitis. 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 Mild poststenotic dilatation is present. No evidence of dissection, vasculitis or other stenosis within the proximal branches. SMA: Patent without evidence of aneurysm, dissection, vasculitis or significant stenosis. Widely patent. Renals: Single renal arteries bilaterally. Both renal arteries are patent without aneurysm, dissection, vasculitis or features of fibromuscular dysplasia IMA: Patent without evidence of aneurysm, dissection, vasculitis or significant stenosis. Inflow: Nonstenotic plaque within the proximal inflow vessels. No evidence of aneurysm, dissection or vasculitis. Veins: No obvious venous abnormality within the limitations of this arterial phase study. Review of the MIP images confirms the above findings. NON-VASCULAR Hepatobiliary: No focal liver abnormality is seen. Patient is post cholecystectomy. Slight prominence of the biliary tree likely related to reservoir effect. No calcified intraductal gallstones. Pancreas: Unremarkable. No pancreatic ductal dilatation or surrounding inflammatory changes. Spleen: Heterogeneity of the spleen  related to phase of contrast. No focal splenic abnormality Adrenals/Urinary Tract: . Adrenal glands are unremarkable. Kidneys are normal, without renal calculi, focal lesion, or hydronephrosis. Bladder is unremarkable. Stomach/Bowel: Distal esophagus, stomach and duodenal sweep are unremarkable. No bowel wall thickening or dilatation. No evidence of obstruction. A normal appendix is visualized. No colonic dilatation or wall thickening. Lymphatic: No suspicious or enlarged lymph nodes in the included lymphatic chains. Reproductive: Uterus is surgically absent. No concerning adnexal lesions. Other: No abdominopelvic free fluid or free gas. No bowel containing hernias. Musculoskeletal: Multilevel degenerative changes are present in the imaged portions of the spine. Features maximal at L4-5 with sclerotic endplate changes. No suspicious osseous lesions. Review of the MIP images confirms the above findings. IMPRESSION: 1. No evidence of acute aortic syndrome. 2. No acute intrathoracic or intra-abdominal process. 3. 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 4. Aortic Atherosclerosis (ICD10-I70.0). Electronically Signed   By: Kreg Shropshire M.D.   On: 12/02/2018 02:15    ____________________________________________   PROCEDURES   Procedure(s) performed (including Critical Care):  Procedures   ____________________________________________   INITIAL IMPRESSION /  MDM / ASSESSMENT AND PLAN / ED COURSE  As part of my medical decision making, I reviewed the following data within the electronic MEDICAL RECORD NUMBER Nursing notes reviewed and incorporated, Labs reviewed , EKG interpreted , Old chart reviewed, Radiograph reviewed  and Notes from prior ED visits   Differential diagnosis  includes, but is not limited to, ACS, PE, AAS or AAA, pneumonia, COVID-19.  The patient's vital signs are stable although she is hypertensive.  Heart rate has been consistently around 60 or even slightly bradycardic.  She has no signs or symptoms of infection.  Comprehensive metabolic panel, CBC, and 2 high-sensitivity troponins are all normal and reassuring.  She likely will be appropriate for discharge and outpatient follow-up and actually already has an appointment scheduled in a little over 24 hours with your primary care provider (Dr. Charlton HawsLithanvong).  However I am concerned about the presence of chest pain with neurological symptoms including the consistent reproduction of right leg numbness with the chest pain and occasional left arm numbness.  Pulmonary embolism is certainly possible but she has no risk factors and if she was under age 60 she would be PERC negative.  She has a wells score for PE of 0.  However based on the neurological symptoms associated with the chest pain, near syncope, hypertension, etc., I am going to obtain CTA chest/abdomen/pelvis to rule out dissection which should also give a good look at the possibility of pulmonary embolism as well.  The patient's renal function is normal and assuming that this study is negative anticipate she will be able to follow-up as an outpatient with her doctor in a little over 24 hours.  Of note, she reported to her nurse that her sister had a stroke last week and she is under a large amount of stress and anxiety as a result of that, which may be contributing to her current symptoms.      Clinical Course as of Dec 01 233  Sun Dec 02, 2018  0230 Reassuring CT Angio with no indication of emergent medical condition, specifically no evidence of PE nor acute aortic syndrome.  The patient is sleeping comfortably.  I updated her about the results and she is comfortable with plan for discharge and outpatient follow-up.  I am going to give her a full dose  aspirin prior to discharge and I gave my usual and customary return precautions.  She will see her primary care doctor in a little over 24 hours.  CT Angio Chest/Abd/Pel for Dissection W and/or W/WO [CF]  0234 As I was ordering the aspirin, I saw that she has history of hives with NSAIDs.  I will hold off on aspirin for now and defer to the judgment of Dr. Charlton HawsLithanvong or if she sees an outpatient cardiologist.   [CF]    Clinical Course User Index [CF] Loleta RoseForbach, Serenah Mill, MD     ____________________________________________  FINAL CLINICAL IMPRESSION(S) / ED DIAGNOSES  Final diagnoses:  Chest pain, unspecified type     MEDICATIONS GIVEN DURING THIS VISIT:  Medications  iohexol (OMNIPAQUE) 350 MG/ML injection 100 mL (100 mLs Intravenous Contrast Given 12/02/18 0144)     ED Discharge Orders    None      *Please note:  Loretta Tyler was evaluated in Emergency Department on 12/02/2018 for the symptoms described in the history of present illness. She was evaluated in the context of the global COVID-19 pandemic, which necessitated consideration that the patient might be at risk for infection with the  SARS-CoV-2 virus that causes COVID-19. Institutional protocols and algorithms that pertain to the evaluation of patients at risk for COVID-19 are in a state of rapid change based on information released by regulatory bodies including the CDC and federal and state organizations. These policies and algorithms were followed during the patient's care in the ED.  Some ED evaluations and interventions may be delayed as a result of limited staffing during the pandemic.*  Note:  This document was prepared using Dragon voice recognition software and may include unintentional dictation errors.   Loleta RoseForbach, Karagan Lehr, MD 12/02/18 [phone]

## 2018-12-02 NOTE — Discharge Instructions (Addendum)
As we discussed, your work-up tonight in the emergency department was reassuring.  Please let Dr. Netty Starring know at your appointment on Monday that you were here and that you had 2 normal troponins, overall reassuring lab work and EKG, and even a CTA of your chest/abdomen/pelvis which did not show any sign of aortic dissection or pulmonary embolism.  Dr. Doy Mince may wish to send you to cardiology for further outpatient work-up.  Return to the emergency department if you develop new or worsening symptoms that concern you.

## 2018-12-05 ENCOUNTER — Encounter: Payer: Self-pay | Admitting: Internal Medicine

## 2018-12-05 ENCOUNTER — Other Ambulatory Visit: Payer: Self-pay

## 2018-12-05 ENCOUNTER — Ambulatory Visit (INDEPENDENT_AMBULATORY_CARE_PROVIDER_SITE_OTHER): Payer: BC Managed Care – PPO | Admitting: Internal Medicine

## 2018-12-05 VITALS — BP 110/80 | HR 85 | Ht 63.0 in | Wt 151.5 lb

## 2018-12-05 DIAGNOSIS — I10 Essential (primary) hypertension: Secondary | ICD-10-CM

## 2018-12-05 DIAGNOSIS — R0789 Other chest pain: Secondary | ICD-10-CM | POA: Diagnosis not present

## 2018-12-05 NOTE — Patient Instructions (Addendum)
Medication Instructions:  Your physician has recommended you make the following change in your medication:   START OTC Ibuprofen 600mg  three times daily for 7 days  START OTC Omeprazole 20mg  daily  If you need a refill on your cardiac medications before your next appointment, please call your pharmacy.   Lab work: None ordered If you have labs (blood work) drawn today and your tests are completely normal, you will receive your results only by: Marland Kitchen MyChart Message (if you have MyChart) OR . A paper copy in the mail If you have any lab test that is abnormal or we need to change your treatment, we will call you to review the results.  Testing/Procedures: None ordered  Follow-Up: At [facility], you and your health needs are our priority.  As part of our continuing mission to provide you with exceptional heart care, we have created designated Provider Care Teams.  These Care Teams include your primary Cardiologist (physician) and Advanced Practice Providers (APPs -  Physician Assistants and Nurse Practitioners) who all work together to provide you with the care you need, when you need it. You will need a follow up appointment in 3-4 weeks.  Please call our office 2 months in advance to schedule this appointment.  You may see  Dr. Saunders Revel  or one of the following Advanced Practice Providers on your designated Care Team:   Murray Hodgkins, NP Christell Faith, PA-C . Marrianne Mood, PA-C  Any Other Special Instructions Will Be Listed Below (If Applicable). N/A

## 2018-12-05 NOTE — Progress Notes (Signed)
New Outpatient Visit Date: 12/05/2018  Referring Provider: Loleta Roseory Forbach, MD [facility] Emergency Department  Chief Complaint: Chest pain  HPI:  [name] is a 58 y.o. female who is being seen today for the evaluation of chest pain at the request of Dr. York CeriseForbach. She has a history of hypertension and [facility] 3 days ago with a two day history of intermittent chest pain.  She also reported associated right leg numbness as well as sporadic left arm numbness.  CTA of the chest, abdomen, and pelvis did not show any acute vascular abnormality.  No significant coronary artery calcification was identified; scattered atherosclerotic calcification was seen in the abdominal aorta.  HS-TnI was negative x 2.  Today, [name] reports that she continues to have daily chest pain.  It is sharp and substernal, lasting a second or two.  There are no exacerbating factors.  She notes some improvement with acetaminophen and ibuprofen.  She reports associated shortness of breath, dizziness, and warmth/tingling throughout her body.  She denies orthopnea, PND, and edema.  She has experienced a "heaviness" throughout her body when standing over the last few days as well as mild lightheadedness.  She denies syncope.  Loretta Tyler denies a history of heart disease and prior cardiac testing.  She denies trauma to the chest as well as recent lifting or other activities that may have brought on the pain.  --------------------------------------------------------------------------------------------------  Cardiovascular History & Procedures: Cardiovascular Problems:  Chest pain  Risk Factors:  Hypertension and prior tobacco use  Cath/PCI:  None  CV Surgery:  None  EP Procedures and Devices:  None  Non-Invasive Evaluation(s):  None  Recent CV Pertinent Labs: Lab Results  Component Value Date   CHOL 153 06/26/2016   HDL 47 06/26/2016   LDLCALC 92 06/26/2016   TRIG 68  06/26/2016   CHOLHDL 3.3 06/26/2016   K 3.6 12/01/2018   BUN 13 12/01/2018   CREATININE 0.75 12/01/2018    --------------------------------------------------------------------------------------------------  Past Medical History:  Diagnosis Date  . Hypertension     Past Surgical History:  Procedure Laterality Date  . ABDOMINAL HYSTERECTOMY    . CHOLECYSTECTOMY      Current Meds  Medication Sig  . hydrochlorothiazide (HYDRODIURIL) 25 MG tablet Take 25 mg by mouth daily.  Marland Kitchen. losartan (COZAAR) 25 MG tablet Take by mouth daily.  . meclizine (ANTIVERT) 12.5 MG tablet Take 1 tablet (12.5 mg total) by mouth 3 (three) times daily as needed for dizziness.  . ranitidine (ZANTAC) 150 MG tablet Take 150 mg by mouth daily as needed for heartburn.    Allergies: Lodine [etodolac] and Morphine and related  Social History   Tobacco Use  . Smoking status: Current Every Day Smoker    Packs/day: 0.25    Years: 30.00    Pack years: 7.50  . Smokeless tobacco: Never Used  Substance Use Topics  . Alcohol use: Not Currently  . Drug use: Not Currently    Family History  Problem Relation Age of Onset  . Heart Problems Father     Review of Systems: A 12-system review of systems was performed and was negative except as noted in the HPI.  --------------------------------------------------------------------------------------------------  Physical Exam: BP 110/80 (BP Location: Right Arm, Patient Position: Sitting, Cuff Size: Normal)   Pulse 85   Ht 5\' 3"  (1.6 m)   Wt 151 lb 8 oz (68.7 kg)   SpO2 98%   BMI 26.84 kg/m   General:  NAD HEENT:  No conjunctival pallor or scleral icterus. Moist mucous membranes. OP clear. Neck: Supple without lymphadenopathy, thyromegaly, JVD, or HJR. No carotid bruit. Lungs: Normal work of breathing. Clear to auscultation bilaterally without wheezes or crackles. Heart: Regular rate and rhythm without murmurs, rubs, or gallops. Non-displaced PMI. Abd: Bowel  sounds present. Soft, NT/ND without hepatosplenomegaly Ext: No lower extremity edema. Radial, PT, and DP pulses are 2+ bilaterally Skin: Warm and dry without rash. Neuro: CNIII-XII intact. Strength and fine-touch sensation intact in upper and lower extremities bilaterally. Psych: Normal mood and affect.  EKG:  NSR without abnormalities.  Lab Results  Component Value Date   WBC 8.1 12/01/2018   HGB 14.2 12/01/2018   HCT 43.0 12/01/2018   MCV 86.9 12/01/2018   PLT 288 12/01/2018    Lab Results  Component Value Date   NA 137 12/01/2018   K 3.6 12/01/2018   CL 103 12/01/2018   CO2 25 12/01/2018   BUN 13 12/01/2018   CREATININE 0.75 12/01/2018   GLUCOSE 105 (H) 12/01/2018   ALT 11 12/01/2018    Lab Results  Component Value Date   CHOL 153 06/26/2016   HDL 47 06/26/2016   LDLCALC 92 06/26/2016   TRIG 68 06/26/2016   CHOLHDL 3.3 06/26/2016     --------------------------------------------------------------------------------------------------  ASSESSMENT AND PLAN: Atypical chest pain: Pain timing and quality is not consistent with angina.  Recent CTA of the chest, abdomen, and pelvis (images personally reviewed) did not show any significant coronary artery calcification, though atherosclerosis was noted in the abdominal aorta.  We have discussed conservative treatment with NSAID's and PPI as well as further diagnostic testing and have agreed to the former.  I have recommmended that Ms. Singleterry take ibuprofen 600 mg TID x 1 week and omeprazole 20 mg daily.  If symptoms do not improve, we will need to consider obtaining an echocardiogram +/- Holter monitor and stress testing.  Hypertension: BP reasonable today.  No medication changes at this time.  Follow-up: RTC in 3-4 weeks.  Nelva Bush, MD 12/05/2018 11:47 AM

## 2018-12-26 ENCOUNTER — Ambulatory Visit: Payer: BC Managed Care – PPO | Admitting: Internal Medicine

## 2018-12-26 NOTE — Progress Notes (Deleted)
Follow-up Outpatient Visit Date: 12/26/2018  Primary Care Provider: Dion Body, MD [place] [id number]  Chief Complaint: ***  HPI:  [name] is a 58 y.o. year-old female with history of hypertension, who presents for follow-up of chest pain.  I met her in mid September after an emergency department visit for 2 days of intermittent chest pain.  At our visit, she continued to have daily sharp, substernal chest pain lasting 1 or 2 seconds.  It seemed to improve with acetaminophen and/or ibuprofen.  Given atypical nature of the pain and recent CTA of the chest, abdomen, and pelvis showing no significant coronary artery calcification, we agreed to conservative therapy with ibuprofen 600 mg 3 times daily x1 week and omeprazole 20 mg daily.  --------------------------------------------------------------------------------------------------  Cardiovascular History & Procedures: Cardiovascular Problems:  Chest pain  Risk Factors:  Hypertension and prior tobacco use  Cath/PCI:  None  CV Surgery:  None  EP Procedures and Devices:  None  Non-Invasive Evaluation(s):  None  Recent CV Pertinent Labs: Lab Results  Component Value Date   CHOL 153 06/26/2016   HDL 47 06/26/2016   LDLCALC 92 06/26/2016   TRIG 68 06/26/2016   CHOLHDL 3.3 06/26/2016   K 3.6 12/01/2018   BUN 13 12/01/2018   CREATININE 0.75 12/01/2018    Past medical and surgical history were reviewed and updated in EPIC.  No outpatient medications have been marked as taking for the 12/26/18 encounter (Appointment) with Jeffrie Lofstrom, [name], MD.    Allergies: Lodine [etodolac] and Morphine and related  Social History   Tobacco Use  . Smoking status: Current Every Day Smoker    Packs/day: 0.25    Years: 30.00    Pack years: 7.50  . Smokeless tobacco: Never Used  Substance Use Topics  . Alcohol use: Not Currently  . Drug use: Not Currently     Family History  Problem Relation Age of Onset  . Heart Problems Father     Review of Systems: A 12-system review of systems was performed and was negative except as noted in the HPI.  --------------------------------------------------------------------------------------------------  Physical Exam: There were no vitals taken for this visit.  General:  *** HEENT: No conjunctival pallor or scleral icterus. Moist mucous membranes.  OP clear. Neck: Supple without lymphadenopathy, thyromegaly, JVD, or HJR. No carotid bruit. Lungs: Normal work of breathing. Clear to auscultation bilaterally without wheezes or crackles. Heart: Regular rate and rhythm without murmurs, rubs, or gallops. Non-displaced PMI. Abd: Bowel sounds present. Soft, NT/ND without hepatosplenomegaly Ext: No lower extremity edema. Radial, PT, and DP pulses are 2+ bilaterally. Skin: Warm and dry without rash.  EKG:  ***  Lab Results  Component Value Date   WBC 8.1 12/01/2018   HGB 14.2 12/01/2018   HCT 43.0 12/01/2018   MCV 86.9 12/01/2018   PLT 288 12/01/2018    Lab Results  Component Value Date   NA 137 12/01/2018   K 3.6 12/01/2018   CL 103 12/01/2018   CO2 25 12/01/2018   BUN 13 12/01/2018   CREATININE 0.75 12/01/2018   GLUCOSE 105 (H) 12/01/2018   ALT 11 12/01/2018    Lab Results  Component Value Date   CHOL 153 06/26/2016   HDL 47 06/26/2016   LDLCALC 92 06/26/2016   TRIG 68 06/26/2016   CHOLHDL 3.3 06/26/2016    --------------------------------------------------------------------------------------------------  ASSESSMENT AND PLAN: [name] [name], MD 12/26/2018 7:32 AM

## 2019-04-05 ENCOUNTER — Other Ambulatory Visit: Payer: Self-pay | Admitting: Family Medicine

## 2019-04-05 DIAGNOSIS — Z1231 Encounter for screening mammogram for malignant neoplasm of breast: Secondary | ICD-10-CM

## 2019-04-09 ENCOUNTER — Ambulatory Visit
Admission: RE | Admit: 2019-04-09 | Discharge: 2019-04-09 | Disposition: A | Discharge status: Home / Self Care | Payer: BC Managed Care – PPO | Source: Ambulatory Visit | Attending: Family Medicine | Admitting: Family Medicine

## 2019-04-09 DIAGNOSIS — Z1231 Encounter for screening mammogram for malignant neoplasm of breast: Secondary | ICD-10-CM | POA: Insufficient documentation

## 2020-05-15 ENCOUNTER — Other Ambulatory Visit: Payer: Self-pay | Admitting: Family Medicine

## 2020-05-15 DIAGNOSIS — M7989 Other specified soft tissue disorders: Secondary | ICD-10-CM

## 2020-05-19 ENCOUNTER — Other Ambulatory Visit: Payer: Self-pay | Admitting: Family Medicine

## 2020-05-19 DIAGNOSIS — Z1231 Encounter for screening mammogram for malignant neoplasm of breast: Secondary | ICD-10-CM

## 2020-05-21 ENCOUNTER — Other Ambulatory Visit: Payer: Self-pay | Admitting: Family Medicine

## 2020-05-21 ENCOUNTER — Ambulatory Visit
Admission: RE | Admit: 2020-05-21 | Discharge: 2020-05-21 | Disposition: A | Discharge status: Home / Self Care | Payer: BC Managed Care – PPO | Source: Ambulatory Visit | Attending: Family Medicine | Admitting: Family Medicine

## 2020-05-21 ENCOUNTER — Other Ambulatory Visit: Payer: Self-pay

## 2020-05-21 DIAGNOSIS — M7989 Other specified soft tissue disorders: Secondary | ICD-10-CM | POA: Insufficient documentation

## 2020-06-09 ENCOUNTER — Other Ambulatory Visit: Payer: Self-pay

## 2020-06-09 ENCOUNTER — Ambulatory Visit
Admission: RE | Admit: 2020-06-09 | Discharge: 2020-06-09 | Disposition: A | Discharge status: Home / Self Care | Payer: BC Managed Care – PPO | Source: Ambulatory Visit | Attending: Family Medicine | Admitting: Family Medicine

## 2020-06-09 DIAGNOSIS — Z1231 Encounter for screening mammogram for malignant neoplasm of breast: Secondary | ICD-10-CM | POA: Diagnosis not present

## 2020-08-26 ENCOUNTER — Emergency Department
Admission: EM | Admit: 2020-08-26 | Discharge: 2020-08-26 | Disposition: A | Discharge status: Home / Self Care | Payer: BC Managed Care – PPO | Attending: Emergency Medicine | Admitting: Emergency Medicine

## 2020-08-26 ENCOUNTER — Other Ambulatory Visit: Payer: Self-pay

## 2020-08-26 ENCOUNTER — Emergency Department: Payer: BC Managed Care – PPO

## 2020-08-26 ENCOUNTER — Encounter: Payer: Self-pay | Admitting: Emergency Medicine

## 2020-08-26 DIAGNOSIS — Z79899 Other long term (current) drug therapy: Secondary | ICD-10-CM | POA: Insufficient documentation

## 2020-08-26 DIAGNOSIS — R42 Dizziness and giddiness: Secondary | ICD-10-CM | POA: Insufficient documentation

## 2020-08-26 DIAGNOSIS — I10 Essential (primary) hypertension: Secondary | ICD-10-CM | POA: Diagnosis not present

## 2020-08-26 DIAGNOSIS — R55 Syncope and collapse: Secondary | ICD-10-CM | POA: Insufficient documentation

## 2020-08-26 DIAGNOSIS — F17200 Nicotine dependence, unspecified, uncomplicated: Secondary | ICD-10-CM | POA: Insufficient documentation

## 2020-08-26 LAB — BASIC METABOLIC PANEL
Anion gap: 12 (ref 5–15)
BUN: 14 mg/dL (ref 8–23)
CO2: 22 mmol/L (ref 22–32)
Calcium: 8.8 mg/dL — ABNORMAL LOW (ref 8.9–10.3)
Chloride: 99 mmol/L (ref 98–111)
Creatinine, Ser: 0.91 mg/dL (ref 0.44–1.00)
GFR, Estimated: >60 mL/min (ref >60)
Glucose, Bld: 112 mg/dL — ABNORMAL HIGH (ref 70–99)
Potassium: 3.4 mmol/L — ABNORMAL LOW (ref 3.5–5.1)
Sodium: 133 mmol/L — ABNORMAL LOW (ref 135–145)

## 2020-08-26 LAB — CBC
HCT: 40.7 % (ref 36.0–46.0)
Hemoglobin: 14.1 g/dL (ref 12.0–15.0)
MCH: 29.7 pg (ref 26.0–34.0)
MCHC: 34.6 g/dL (ref 30.0–36.0)
MCV: 85.7 fL (ref 80.0–100.0)
Platelets: 294 10*3/uL (ref 150–400)
RBC: 4.75 MIL/uL (ref 3.87–5.11)
RDW: 12.4 % (ref 11.5–15.5)
WBC: 9.1 10*3/uL (ref 4.0–10.5)
nRBC: 0 % (ref 0.0–0.2)

## 2020-08-26 LAB — TROPONIN I (HIGH SENSITIVITY)
Troponin I (High Sensitivity): 3 ng/L (ref <18)
Troponin I (High Sensitivity): 3 ng/L (ref <18)

## 2020-08-26 MED ORDER — SODIUM CHLORIDE 0.9 % IV BOLUS
1000.0000 mL | Freq: Once | INTRAVENOUS | Status: AC
  Administered 2020-08-26: 1000 mL via INTRAVENOUS

## 2020-08-26 NOTE — ED Notes (Signed)
ED Provider at bedside. 

## 2020-08-26 NOTE — Discharge Instructions (Addendum)
Please seek medical attention for any high fevers, chest pain, shortness of breath, change in behavior, persistent vomiting, bloody stool or any other new or concerning symptoms.  

## 2020-08-26 NOTE — ED Notes (Signed)
Dr Derrill Kay notified of orthostatic VS.

## 2020-08-26 NOTE — ED Provider Notes (Signed)
[facility] Emergency Department Provider Note  ____________________________________________   I have reviewed the triage vital signs and the nursing notes.   HISTORY  Chief Complaint LIghtheadedness  History limited by: Not Limited   HPI Loretta Tyler is a 60 y.o. female who presents to the emergency department today because of concern for lightheadedness. The patient states that she has been having this symptom for the past few days. The patient says that it has been intermittent. It will not necessarily follow any pattern that the patient has been able to identify. The patient says that she has a history of vertigo and it does somewhat remind her of that. She did have some discomfort in her chest as well.    Records reviewed. Per medical record review patient has a history of HTN.  Past Medical History:  Diagnosis Date  . Hypertension     Patient Active Problem List   Diagnosis Date Noted  . HTN (hypertension) 06/25/2016  . Atypical chest pain 06/25/2016    Past Surgical History:  Procedure Laterality Date  . ABDOMINAL HYSTERECTOMY    . CHOLECYSTECTOMY      Prior to Admission medications   Medication Sig Start Date End Date Taking? Authorizing Provider  hydrochlorothiazide (HYDRODIURIL) 25 MG tablet Take 25 mg by mouth daily.    [provider]  losartan (COZAAR) 25 MG tablet Take by mouth daily. 12/03/18 12/03/19  [provider]  meclizine (ANTIVERT) 12.5 MG tablet Take 1 tablet (12.5 mg total) by mouth 3 (three) times daily as needed for dizziness. 06/26/16   Alford Highland, MD  ranitidine (ZANTAC) 150 MG tablet Take 150 mg by mouth daily as needed for heartburn.    [provider]    Allergies Lodine [etodolac] and Morphine and related  Family History  Problem Relation Age of Onset  . Heart Problems Father   . Breast cancer Neg Hx     Social History Social History   Tobacco Use  . Smoking status:  Current Every Day Smoker    Packs/day: 0.25    Years: 30.00    Pack years: 7.50  . Smokeless tobacco: Never Used  Substance Use Topics  . Alcohol use: Not Currently  . Drug use: Not Currently    Review of Systems Constitutional: No fever/chills Eyes: No visual changes. ENT: No sore throat. Cardiovascular: Positive for chest discomfort.  Respiratory: Denies shortness of breath. Gastrointestinal: No abdominal pain.  No nausea, no vomiting.  No diarrhea.   Genitourinary: Negative for dysuria. Musculoskeletal: Negative for back pain. Skin: Negative for rash. Neurological: Positive for lightheadedness.  ____________________________________________   PHYSICAL EXAM:  VITAL SIGNS: ED Triage Vitals  Enc Vitals Group     BP 08/26/20 1527 125/88     Pulse Rate 08/26/20 1527 83     Resp 08/26/20 1527 18     Temp 08/26/20 1527 98.4 F (36.9 C)     Temp Source 08/26/20 1527 Oral     SpO2 08/26/20 1527 97 %     Weight 08/26/20 1527 149 lb (67.6 kg)     Height 08/26/20 1527 5\' 3"  (1.6 m)     Head Circumference --      Peak Flow --      Pain Score 08/26/20 1531 4   Constitutional: Alert and oriented.  Eyes: Conjunctivae are normal.  ENT      Head: Normocephalic and atraumatic.      Nose: No congestion/rhinnorhea.      Mouth/Throat: Mucous membranes  are moist.      Neck: No stridor. Hematological/Lymphatic/Immunilogical: No cervical lymphadenopathy. Cardiovascular: Normal rate, regular rhythm.  No murmurs, rubs, or gallops.  Respiratory: Normal respiratory effort without tachypnea nor retractions. Breath sounds are clear and equal bilaterally. No wheezes/rales/rhonchi. Gastrointestinal: Soft and non tender. No rebound. No guarding.  Genitourinary: Deferred Musculoskeletal: Normal range of motion in all extremities. No lower extremity edema. Neurologic:  Normal speech and language. No gross focal neurologic deficits are appreciated.  Skin:  Skin is warm, dry and intact. No rash  noted. Psychiatric: Mood and affect are normal. Speech and behavior are normal. Patient exhibits appropriate insight and judgment.  ____________________________________________    LABS (pertinent positives/negatives)  BMP na 133, k 3.4, glu 112, cr 0.91 Trop hs 3 x 2 CBC wbc 9.1, hgb 14.1, plt 294 ____________________________________________   EKG  I, Phineas Semen, attending physician, personally viewed and interpreted this EKG  EKG Time: 1512 Rate: 92 Rhythm: normal sinus rhythm Axis: normal Intervals: qtc 437 QRS: narrow ST changes: no st elevation Impression: right atrial enlargement    ____________________________________________    RADIOLOGY  CXR No acute findings   ____________________________________________   PROCEDURES  Procedures  ____________________________________________   INITIAL IMPRESSION / ASSESSMENT AND PLAN / ED COURSE  Pertinent labs & imaging results that were available during my care of the patient were reviewed by me and considered in my medical decision making (see chart for details).   Patient presents to the emergency department today because of episodes of intermittent lightheadedness.  This was accompanied by some chest pain.  Patient was found to be orthostatic here in the emergency department.  Troponin negative x2, I doubt ACS. I also doubt dissection or PE given clinical history. Patient was given fluids and did feel better afterwards. Do wonder if lightheadedness was in part due to dehydration. Encouraged PCP follow up with patient.   ____________________________________________   FINAL CLINICAL IMPRESSION(S) / ED DIAGNOSES  Final diagnoses:  Near syncope     Note: This dictation was prepared with Dragon dictation. Any transcriptional errors that result from this process are unintentional     Phineas Semen, MD 08/26/20 2250

## 2020-08-26 NOTE — ED Triage Notes (Signed)
Pt comes into the ED via POV c/o central chest pain that has no radiation.  Pt does admit to [facility] and dizziness to the point where she has near syncopal episodes.  Pt denies any cardiac history.  Pt states this pain has been ongoing x 3 days.  Pt currently has even and unlabored respirations and is in NAD.

## 2020-11-10 IMAGING — CR DG CHEST 2V
1 series · 2 of 2 positions shown · non-contrast
Comparison: 06/25/2016

CLINICAL DATA: Chest pain

EXAM:
CHEST - 2 VIEW

[Series 1: dg chest 2 view · 0.14mm/px · 2 of 2 slices shown]
[im 1/2]
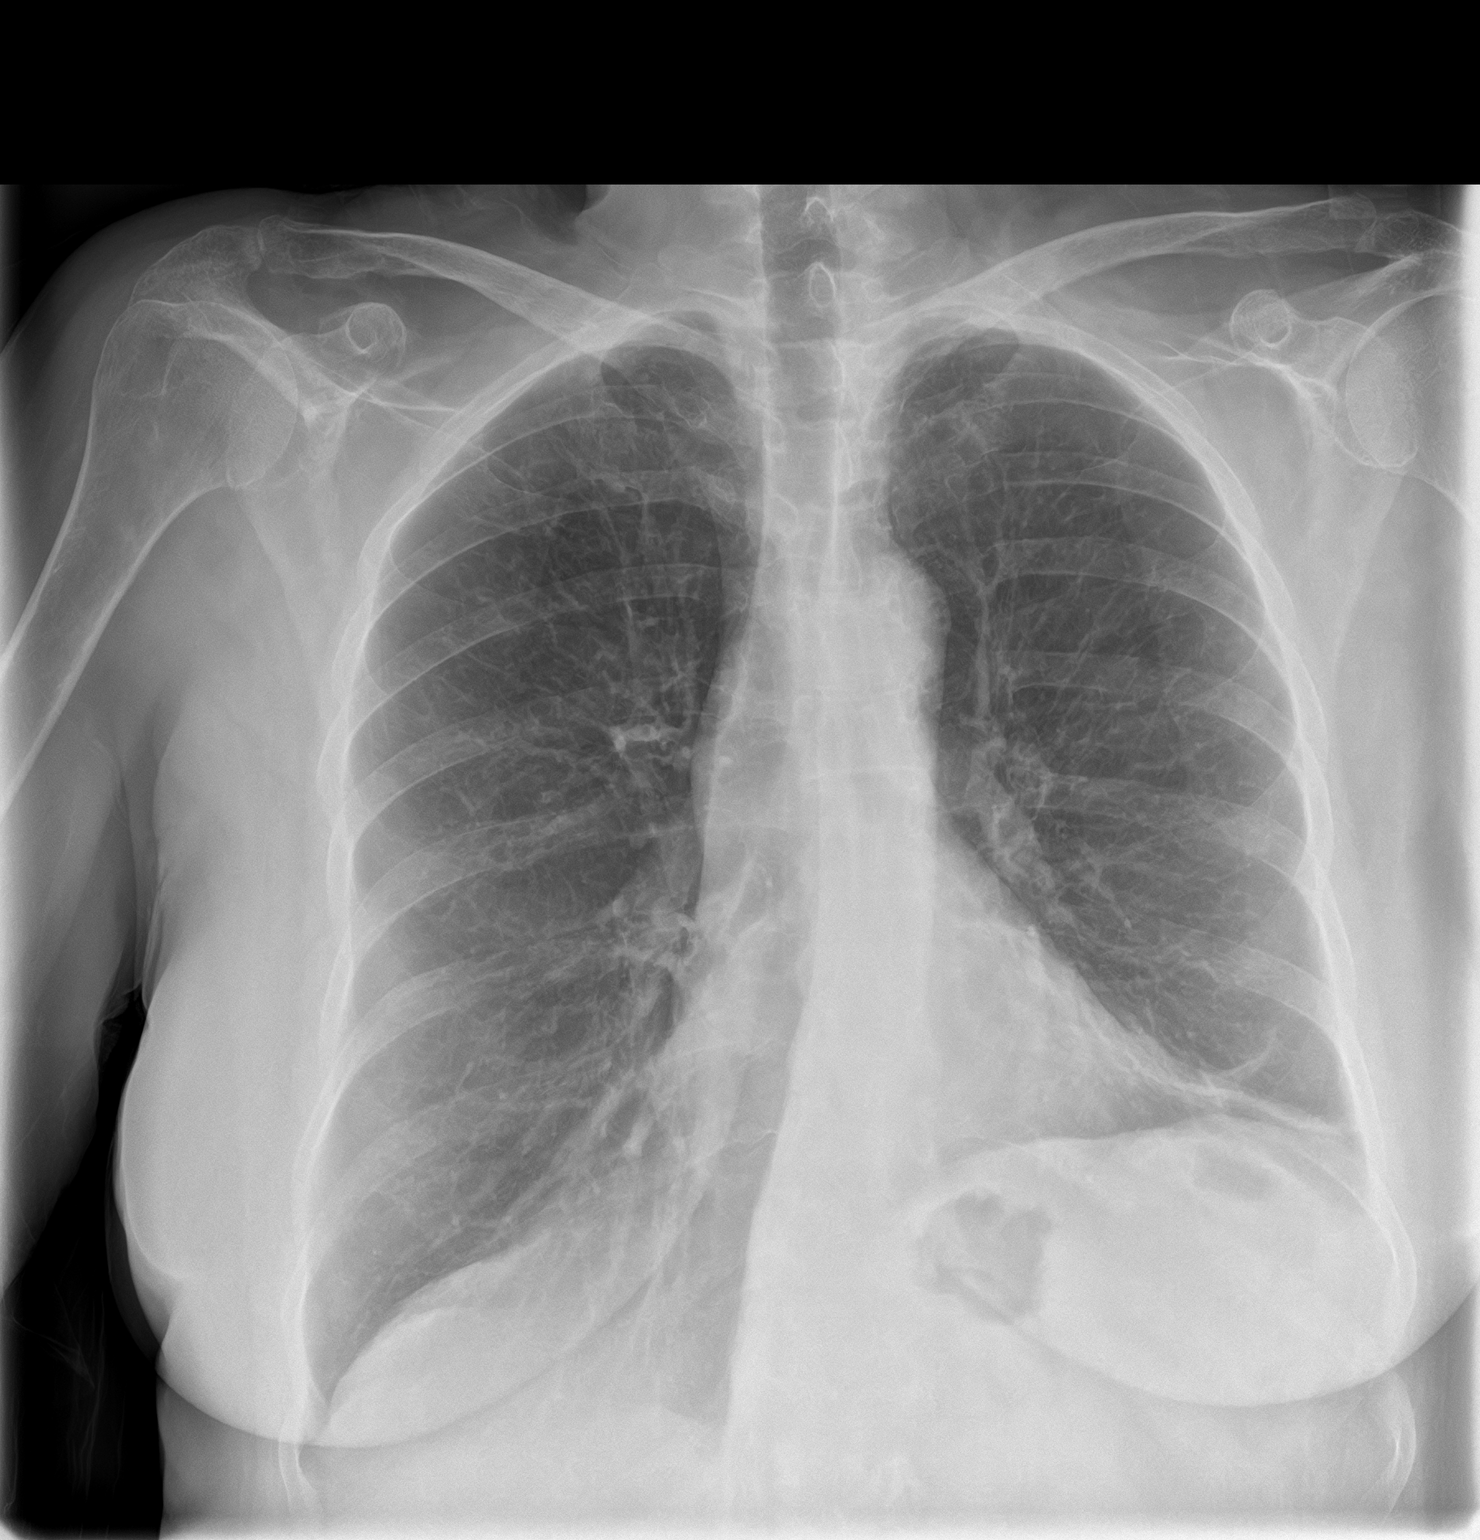
[im 2/2]
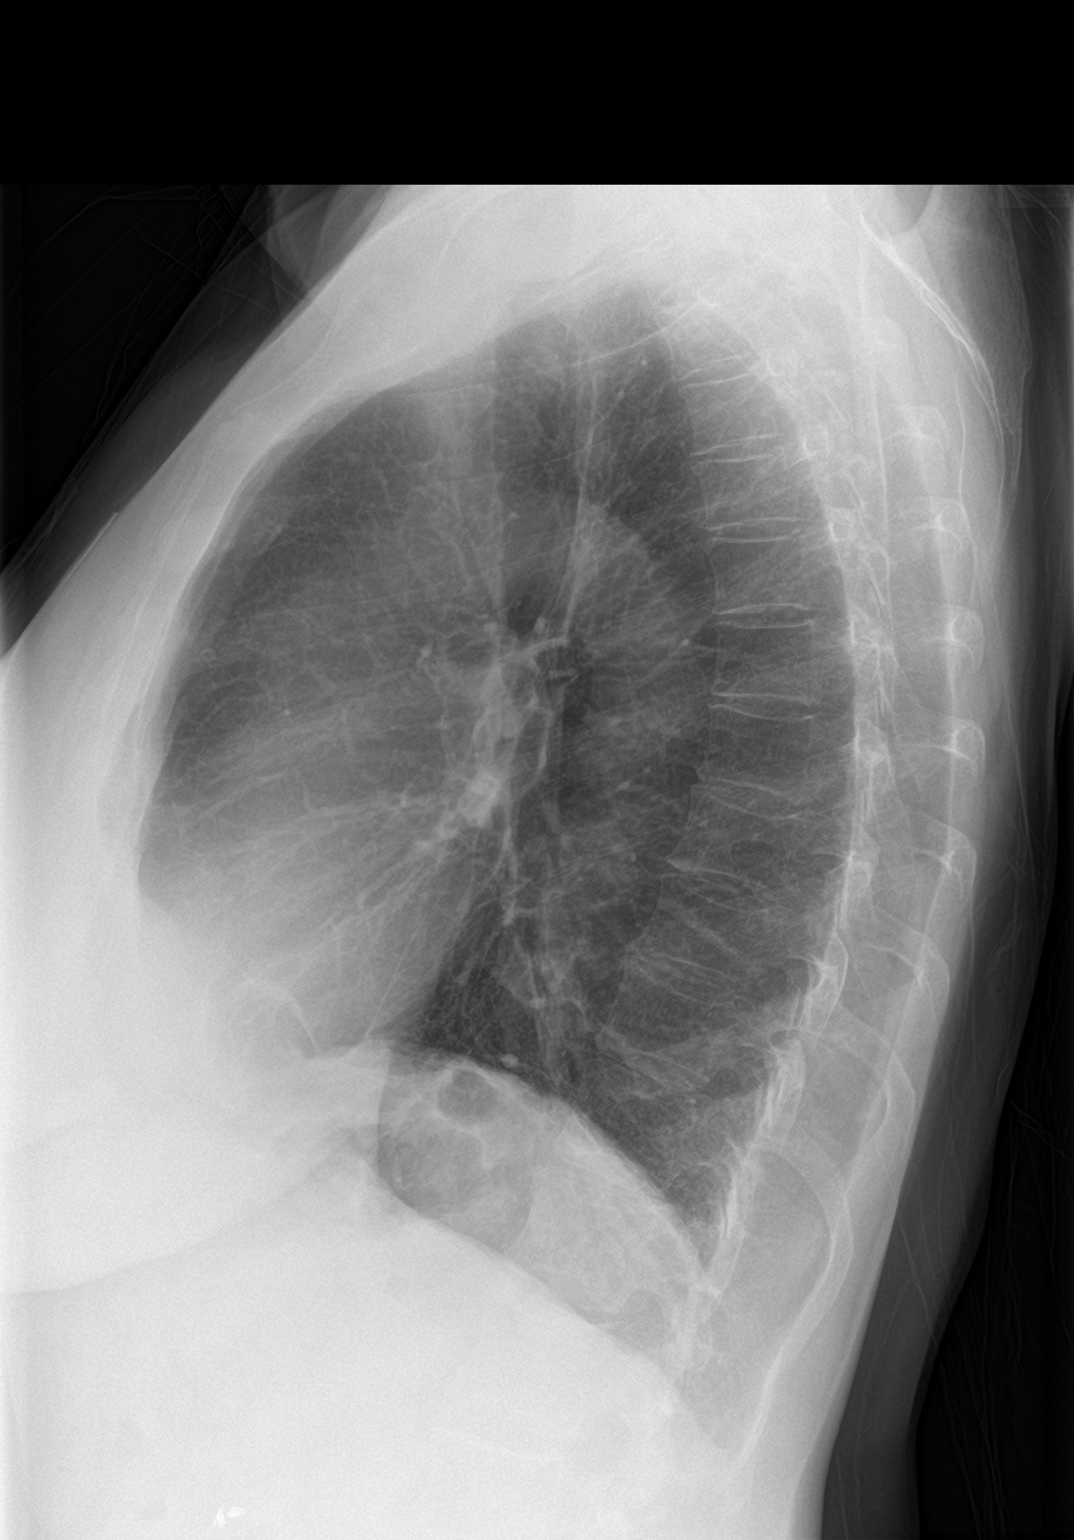

[2 of 2 positions shown; findings below may reference images not displayed]

FINDINGS: Elevated left diaphragm. Subsegmental atelectasis or scar at the
left base. Right lung is clear. Heart size is normal. No
pneumothorax.
IMPRESSION: No active cardiopulmonary disease. Subsegmental atelectasis or scar
at the left base.

## 2022-05-20 IMAGING — MG MM DIGITAL SCREENING BILAT W/ TOMO AND CAD
8 series · 8 of 24 positions shown · non-contrast
Comparison: Previous exam(s).

CLINICAL DATA: Screening.

EXAM:
DIGITAL SCREENING BILATERAL MAMMOGRAM WITH TOMOSYNTHESIS AND CAD
TECHNIQUE: Bilateral screening digital craniocaudal and mediolateral oblique
mammograms were obtained. Bilateral screening digital breast
tomosynthesis was performed. The images were evaluated with
computer-aided detection.

[R CC synth-2D]
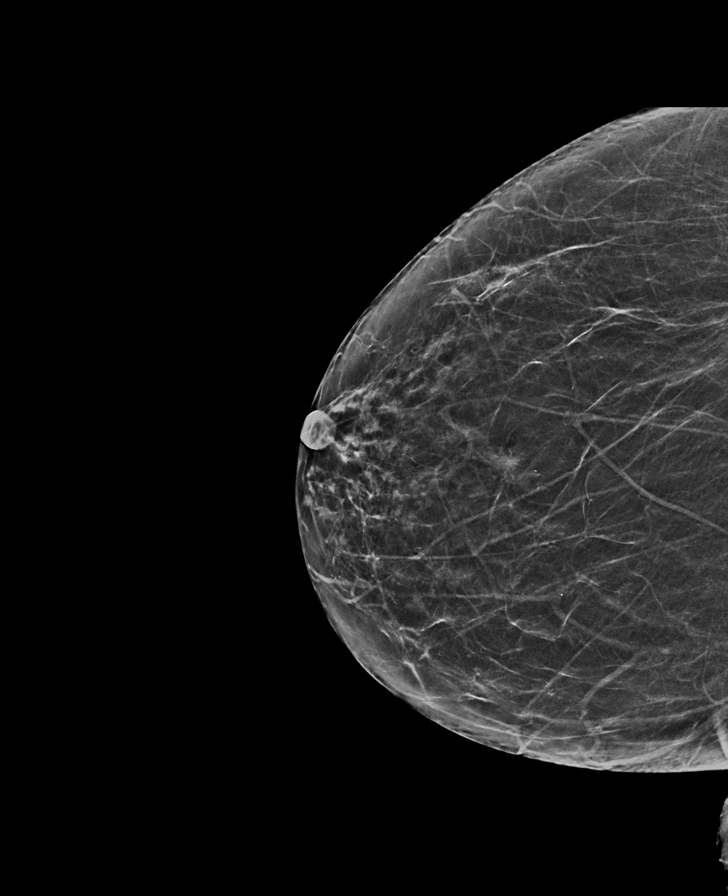

[R MLO synth-2D]
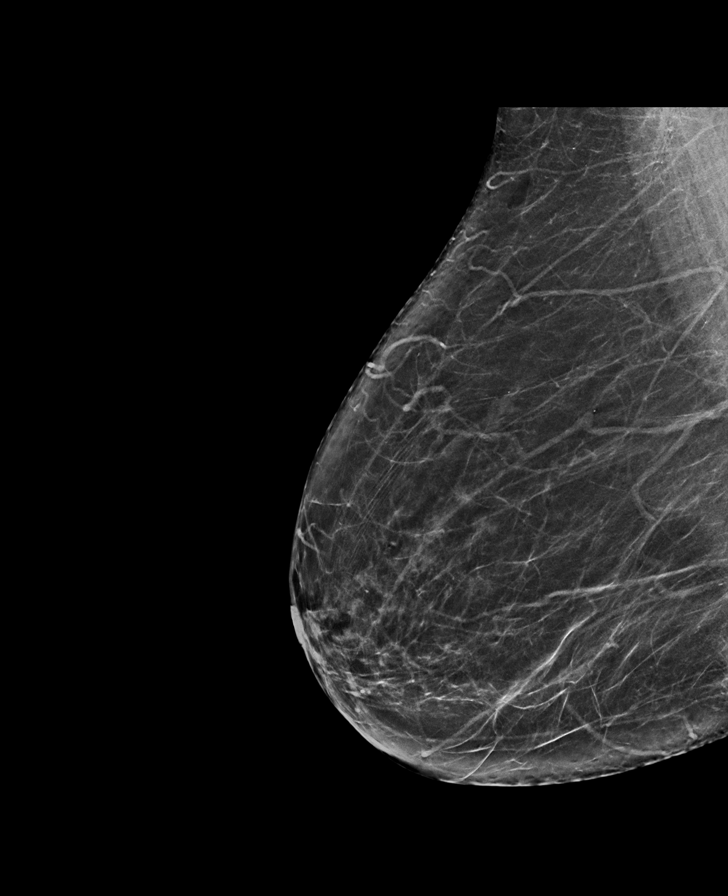

[L MLO synth-2D]
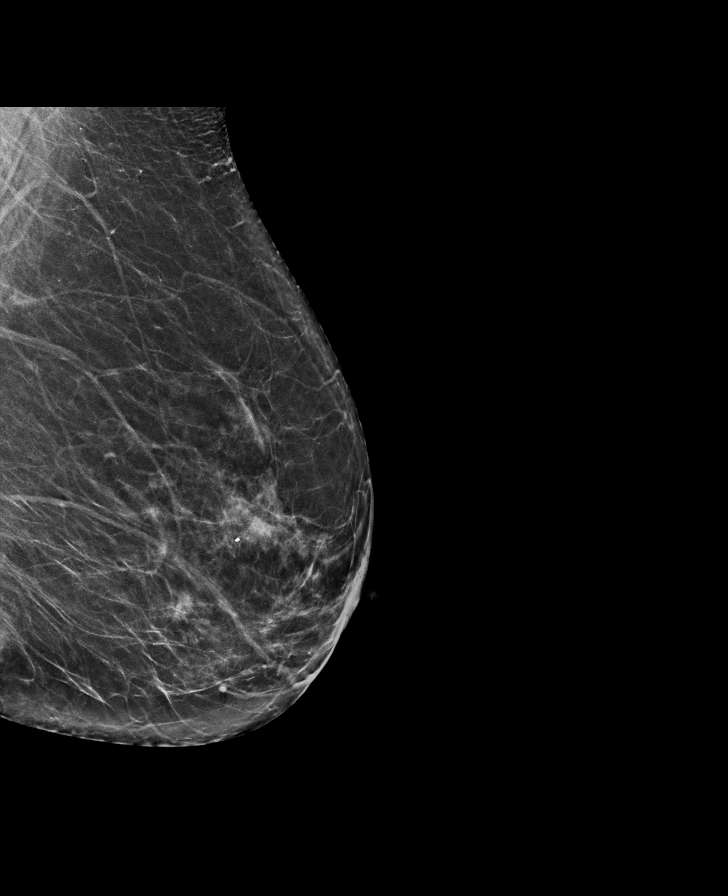

[L CC synth-2D]
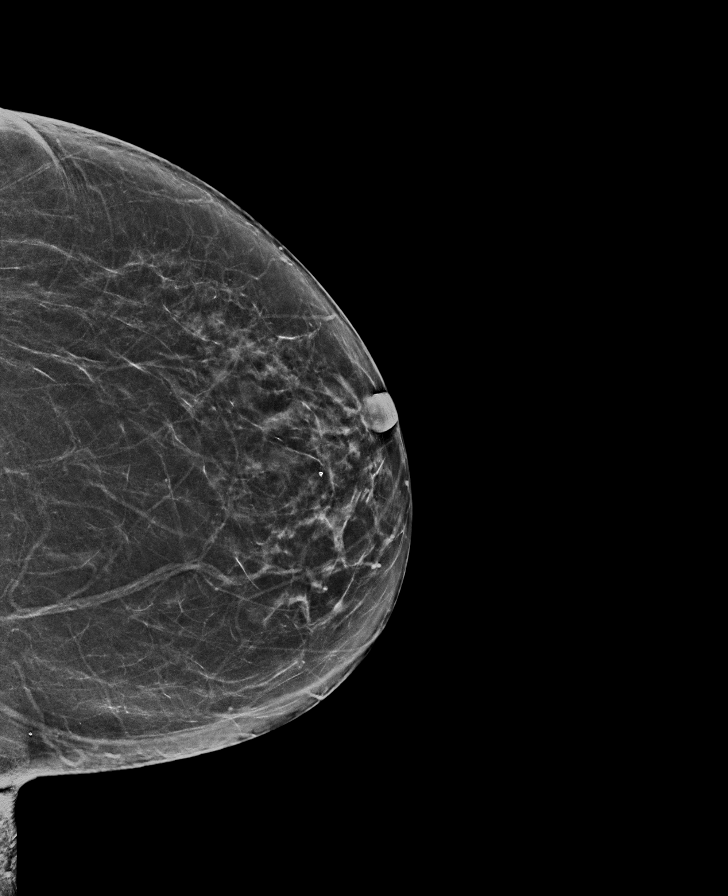

[R CC tomo · tomo slice 30/59.0]
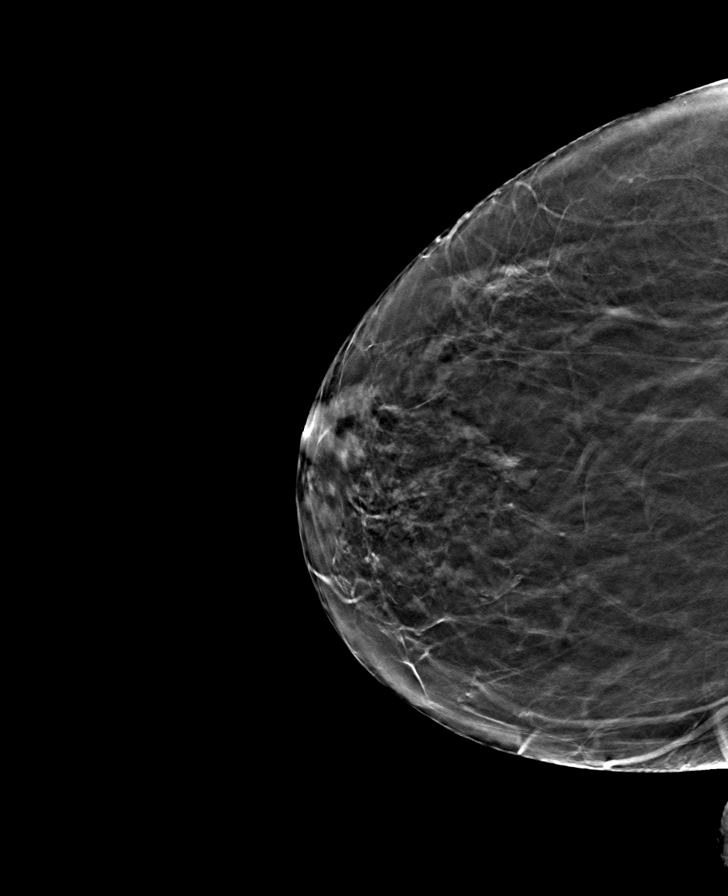

[L MLO tomo · tomo slice 35/68.0]
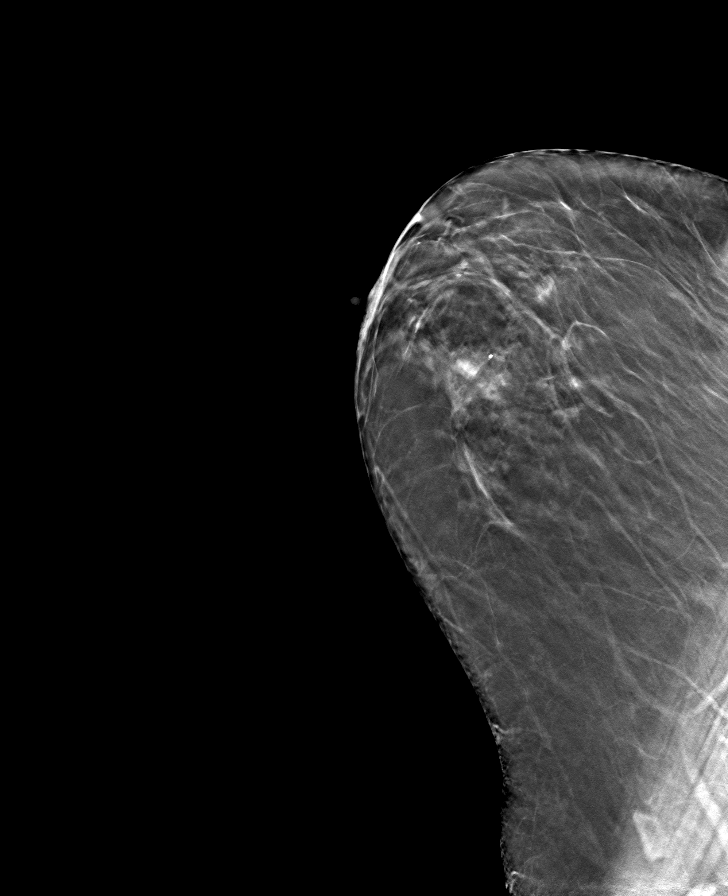

[L CC tomo · tomo slice 31/62.0]
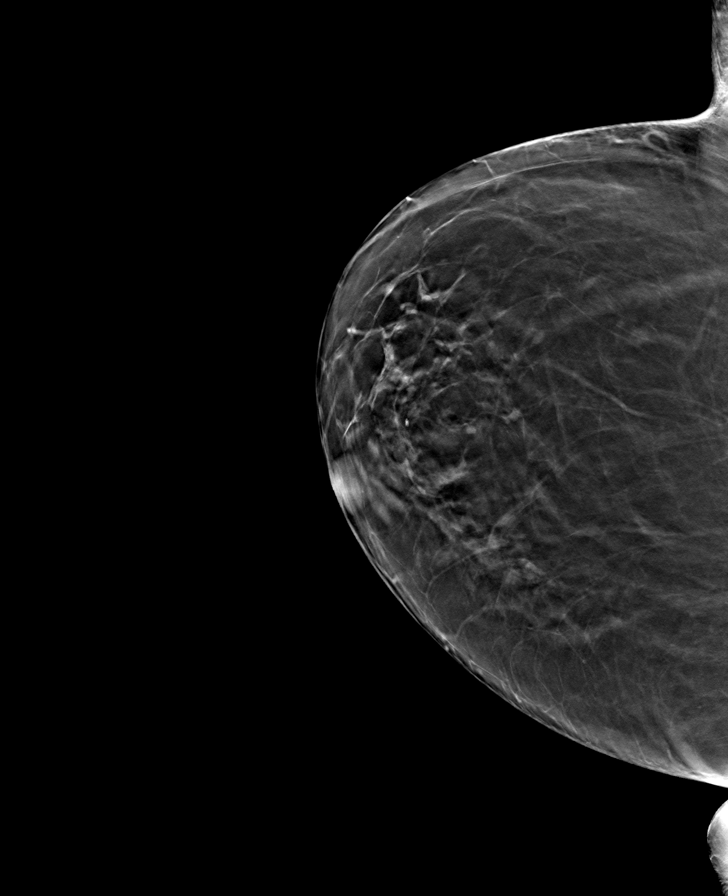

[R MLO tomo · tomo slice 34/67.0]
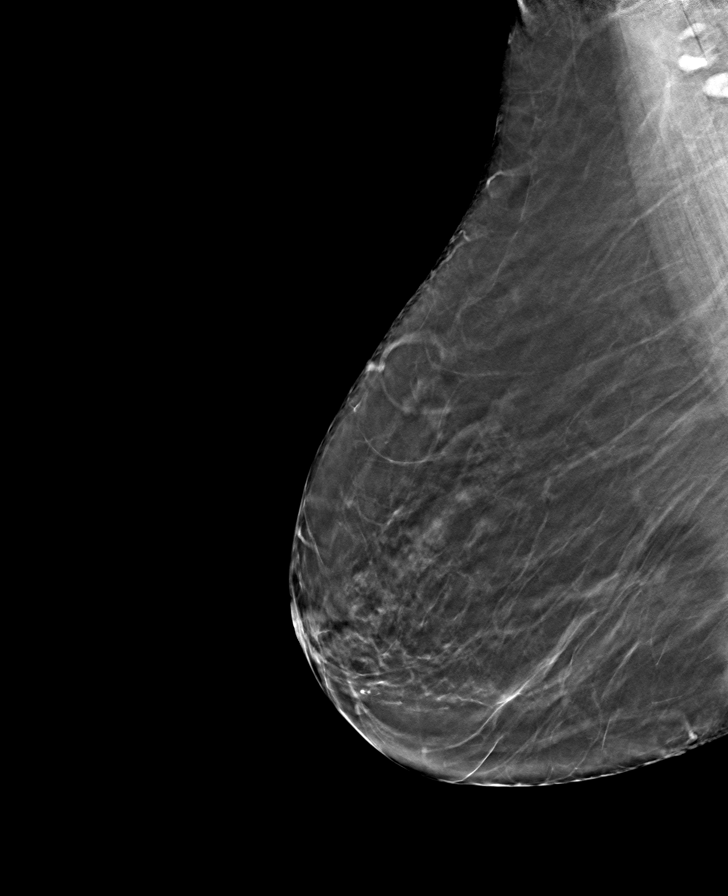

[8 of 24 positions shown; findings below may reference images not displayed]

ACR Breast Density Category b: There are scattered areas of
fibroglandular density.
FINDINGS: There are no findings suspicious for malignancy. The images were
evaluated with computer-aided detection.
IMPRESSION: No mammographic evidence of malignancy. A result letter of this
screening mammogram will be mailed directly to the patient.

RECOMMENDATION:
Screening mammogram in one year. (Code:WJ-I-BG6)

BI-RADS CATEGORY  1: Negative.

## 2022-08-06 IMAGING — CR DG CHEST 2V
2 series · 2 of 2 positions shown · non-contrast
Comparison: 12/01/2018

CLINICAL DATA: Chest pain.  Shortness of breath.

EXAM:
CHEST - 2 VIEW

[chest pa]
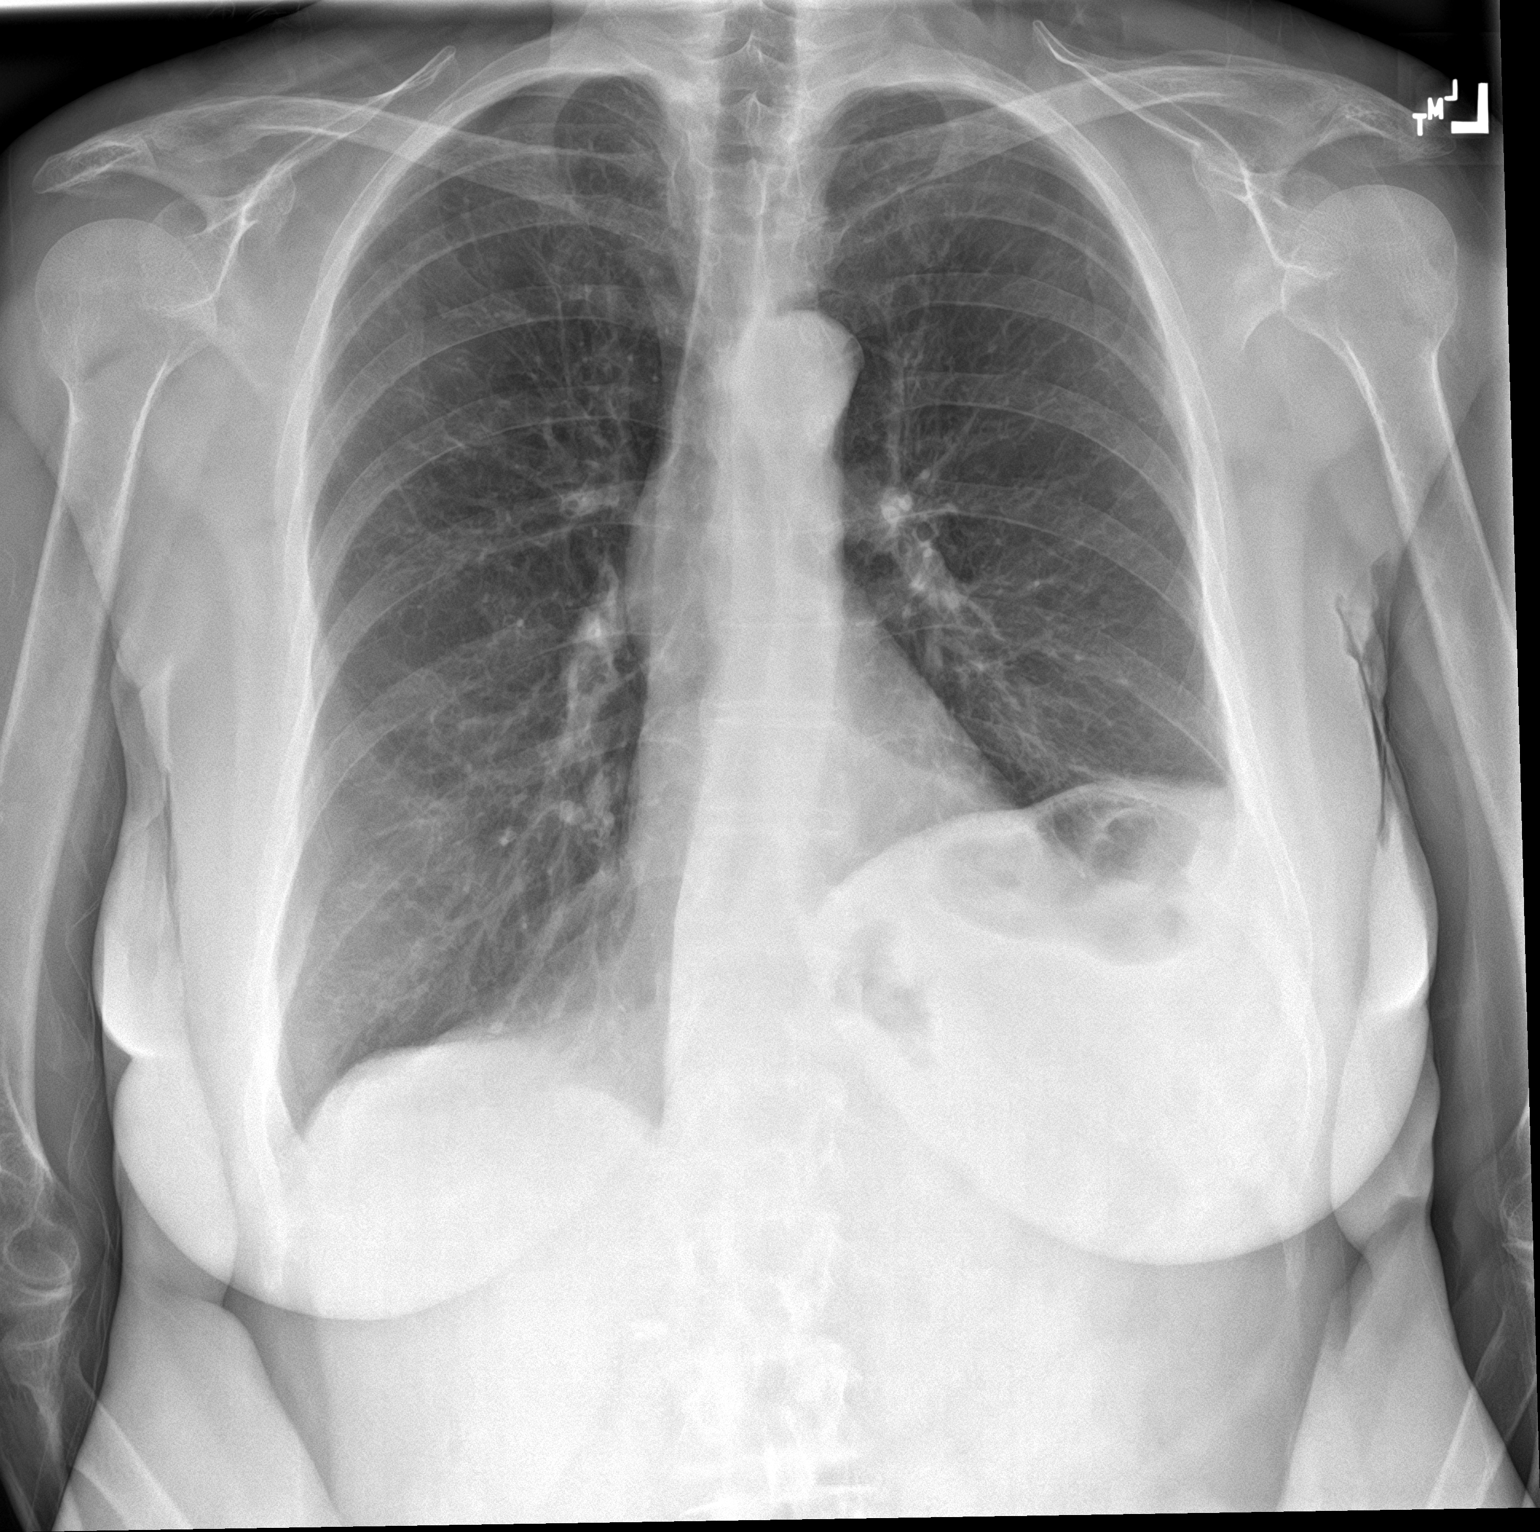

[chest lat]
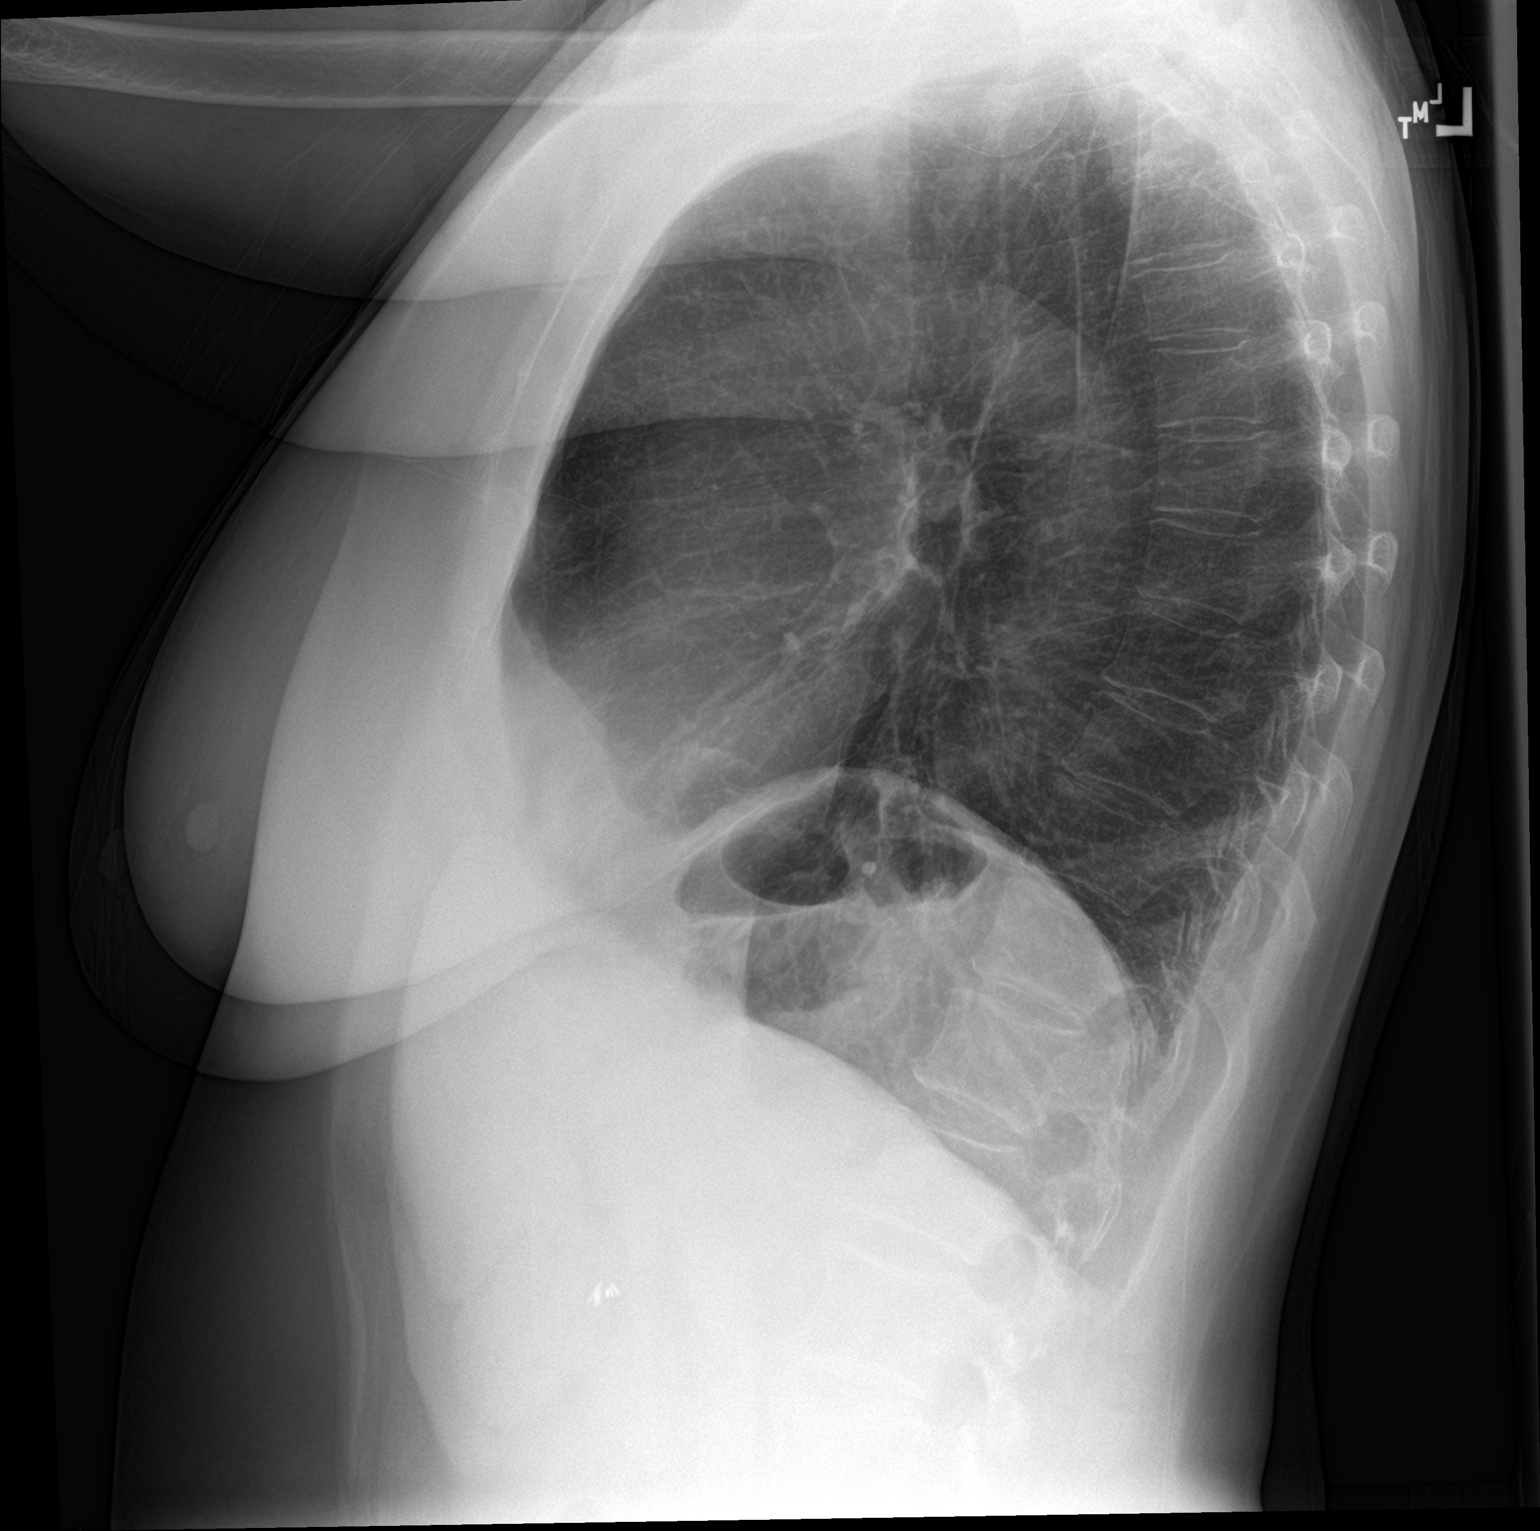

[2 of 2 positions shown; findings below may reference images not displayed]

FINDINGS: Chronic elevation of left hemidiaphragm with adjacent
atelectasis/scar. No acute airspace disease. Normal heart size and
mediastinal contours. No pleural effusion or pneumothorax. No
pulmonary edema. No acute osseous abnormalities are seen.
IMPRESSION: Chronic elevation of left hemidiaphragm with adjacent
atelectasis/scar. No acute findings.
